# Patient Record
Sex: Female | Born: 1935 | Race: White | Hispanic: No | Marital: Married | Smoking: Never smoker
Health system: Southern US, Community
[De-identification: ages and names within clinical notes are randomized; demographics above are authoritative.]

## PROBLEM LIST (undated history)

## (undated) DIAGNOSIS — J449 Chronic obstructive pulmonary disease, unspecified: Secondary | ICD-10-CM

## (undated) DIAGNOSIS — K224 Dyskinesia of esophagus: Secondary | ICD-10-CM

## (undated) DIAGNOSIS — J45909 Unspecified asthma, uncomplicated: Secondary | ICD-10-CM

## (undated) DIAGNOSIS — I499 Cardiac arrhythmia, unspecified: Secondary | ICD-10-CM

## (undated) DIAGNOSIS — R519 Headache, unspecified: Secondary | ICD-10-CM

## (undated) DIAGNOSIS — K219 Gastro-esophageal reflux disease without esophagitis: Secondary | ICD-10-CM

## (undated) DIAGNOSIS — R609 Edema, unspecified: Secondary | ICD-10-CM

## (undated) DIAGNOSIS — F419 Anxiety disorder, unspecified: Secondary | ICD-10-CM

## (undated) DIAGNOSIS — E039 Hypothyroidism, unspecified: Secondary | ICD-10-CM

## (undated) DIAGNOSIS — M8580 Other specified disorders of bone density and structure, unspecified site: Secondary | ICD-10-CM

## (undated) DIAGNOSIS — G629 Polyneuropathy, unspecified: Secondary | ICD-10-CM

## (undated) DIAGNOSIS — R51 Headache: Secondary | ICD-10-CM

## (undated) DIAGNOSIS — G459 Transient cerebral ischemic attack, unspecified: Secondary | ICD-10-CM

## (undated) DIAGNOSIS — H9190 Unspecified hearing loss, unspecified ear: Secondary | ICD-10-CM

## (undated) DIAGNOSIS — I10 Essential (primary) hypertension: Secondary | ICD-10-CM

## (undated) DIAGNOSIS — M1990 Unspecified osteoarthritis, unspecified site: Secondary | ICD-10-CM

## (undated) HISTORY — PX: THYROIDECTOMY, PARTIAL: SHX18

## (undated) HISTORY — PX: CHOLECYSTECTOMY: SHX55

## (undated) HISTORY — PX: APPENDECTOMY: SHX54

---

## 2004-02-20 ENCOUNTER — Ambulatory Visit: Payer: Self-pay | Admitting: Internal Medicine

## 2004-07-18 ENCOUNTER — Ambulatory Visit: Payer: Self-pay | Admitting: Otolaryngology

## 2005-02-24 ENCOUNTER — Ambulatory Visit: Payer: Self-pay | Admitting: Internal Medicine

## 2005-07-22 ENCOUNTER — Ambulatory Visit: Payer: Self-pay | Admitting: Internal Medicine

## 2005-08-04 ENCOUNTER — Encounter: Payer: Self-pay | Admitting: Internal Medicine

## 2005-08-07 ENCOUNTER — Encounter: Payer: Self-pay | Admitting: Internal Medicine

## 2005-09-06 ENCOUNTER — Encounter: Payer: Self-pay | Admitting: Internal Medicine

## 2006-02-26 ENCOUNTER — Ambulatory Visit: Payer: Self-pay | Admitting: Internal Medicine

## 2007-03-04 ENCOUNTER — Ambulatory Visit: Payer: Self-pay | Admitting: Internal Medicine

## 2007-04-13 ENCOUNTER — Ambulatory Visit: Payer: Self-pay | Admitting: Unknown Physician Specialty

## 2008-03-05 ENCOUNTER — Ambulatory Visit: Payer: Self-pay | Admitting: Internal Medicine

## 2009-03-06 ENCOUNTER — Ambulatory Visit: Payer: Self-pay | Admitting: Internal Medicine

## 2010-03-07 ENCOUNTER — Ambulatory Visit: Payer: Self-pay | Admitting: Internal Medicine

## 2011-03-18 ENCOUNTER — Ambulatory Visit: Payer: Self-pay | Admitting: Internal Medicine

## 2012-03-18 ENCOUNTER — Ambulatory Visit: Payer: Self-pay | Admitting: Internal Medicine

## 2012-03-28 ENCOUNTER — Ambulatory Visit: Payer: Self-pay | Admitting: Internal Medicine

## 2012-07-21 ENCOUNTER — Ambulatory Visit: Payer: Self-pay | Admitting: Unknown Physician Specialty

## 2012-07-25 LAB — PATHOLOGY REPORT

## 2013-02-20 ENCOUNTER — Emergency Department: Payer: Self-pay | Admitting: Emergency Medicine

## 2013-02-20 LAB — CBC
HCT: 39.4 % (ref 35.0–47.0)
MCV: 93 fL (ref 80–100)
Platelet: 273 10*3/uL (ref 150–440)
RBC: 4.25 10*6/uL (ref 3.80–5.20)
RDW: 14.4 % (ref 11.5–14.5)
WBC: 10.6 10*3/uL (ref 3.6–11.0)

## 2013-02-20 LAB — URINALYSIS, COMPLETE
Bacteria: NONE SEEN
Bilirubin,UR: NEGATIVE
Nitrite: NEGATIVE
Ph: 6 (ref 4.5–8.0)
RBC,UR: 1 /HPF (ref 0–5)
Specific Gravity: 1.02 (ref 1.003–1.030)
Squamous Epithelial: <1
WBC UR: <1 /HPF (ref 0–5)

## 2013-02-20 LAB — COMPREHENSIVE METABOLIC PANEL
Albumin: 3.2 g/dL — ABNORMAL LOW (ref 3.4–5.0)
Anion Gap: 4 — ABNORMAL LOW (ref 7–16)
BUN: 21 mg/dL — ABNORMAL HIGH (ref 7–18)
Bilirubin,Total: 0.3 mg/dL (ref 0.2–1.0)
Calcium, Total: 9.1 mg/dL (ref 8.5–10.1)
Chloride: 104 mmol/L (ref 98–107)
Creatinine: 0.8 mg/dL (ref 0.60–1.30)
EGFR (African American): >60
EGFR (Non-African Amer.): >60
Glucose: 120 mg/dL — ABNORMAL HIGH (ref 65–99)
Potassium: 4.7 mmol/L (ref 3.5–5.1)
SGOT(AST): 26 U/L (ref 15–37)
Sodium: 135 mmol/L — ABNORMAL LOW (ref 136–145)

## 2013-02-20 LAB — PROTIME-INR: INR: 1

## 2013-03-21 ENCOUNTER — Ambulatory Visit: Payer: Self-pay | Admitting: Family Medicine

## 2014-03-22 ENCOUNTER — Ambulatory Visit: Payer: Self-pay | Admitting: Family Medicine

## 2014-06-28 ENCOUNTER — Ambulatory Visit
Admit: 2014-06-28 | Disposition: A | Discharge status: Home / Self Care | Payer: Self-pay | Attending: Surgery | Admitting: Surgery

## 2015-02-28 ENCOUNTER — Other Ambulatory Visit: Payer: Self-pay | Admitting: Surgery

## 2015-02-28 DIAGNOSIS — Z1231 Encounter for screening mammogram for malignant neoplasm of breast: Secondary | ICD-10-CM

## 2015-03-25 ENCOUNTER — Ambulatory Visit
Admission: RE | Admit: 2015-03-25 | Discharge: 2015-03-25 | Disposition: A | Discharge status: Home / Self Care | Payer: Medicare Other | Source: Ambulatory Visit | Attending: Surgery | Admitting: Surgery

## 2015-03-25 ENCOUNTER — Other Ambulatory Visit: Payer: Self-pay | Admitting: Surgery

## 2015-03-25 DIAGNOSIS — Z1231 Encounter for screening mammogram for malignant neoplasm of breast: Secondary | ICD-10-CM | POA: Diagnosis present

## 2015-07-31 ENCOUNTER — Encounter: Payer: Self-pay | Admitting: Emergency Medicine

## 2015-07-31 ENCOUNTER — Emergency Department: Payer: Medicare Other

## 2015-07-31 ENCOUNTER — Observation Stay
Admission: EM | Admit: 2015-07-31 | Discharge: 2015-08-01 | Disposition: A | Discharge status: Home / Self Care | Payer: Medicare Other | Attending: Internal Medicine | Admitting: Internal Medicine

## 2015-07-31 DIAGNOSIS — Z8673 Personal history of transient ischemic attack (TIA), and cerebral infarction without residual deficits: Secondary | ICD-10-CM | POA: Diagnosis not present

## 2015-07-31 DIAGNOSIS — S2241XA Multiple fractures of ribs, right side, initial encounter for closed fracture: Principal | ICD-10-CM | POA: Insufficient documentation

## 2015-07-31 DIAGNOSIS — J449 Chronic obstructive pulmonary disease, unspecified: Secondary | ICD-10-CM | POA: Diagnosis not present

## 2015-07-31 DIAGNOSIS — M8580 Other specified disorders of bone density and structure, unspecified site: Secondary | ICD-10-CM | POA: Insufficient documentation

## 2015-07-31 DIAGNOSIS — Y92091 Bathroom in other non-institutional residence as the place of occurrence of the external cause: Secondary | ICD-10-CM | POA: Insufficient documentation

## 2015-07-31 DIAGNOSIS — E039 Hypothyroidism, unspecified: Secondary | ICD-10-CM | POA: Insufficient documentation

## 2015-07-31 DIAGNOSIS — Y93E1 Activity, personal bathing and showering: Secondary | ICD-10-CM | POA: Diagnosis not present

## 2015-07-31 DIAGNOSIS — W010XXA Fall on same level from slipping, tripping and stumbling without subsequent striking against object, initial encounter: Secondary | ICD-10-CM | POA: Insufficient documentation

## 2015-07-31 DIAGNOSIS — S2239XA Fracture of one rib, unspecified side, initial encounter for closed fracture: Secondary | ICD-10-CM | POA: Diagnosis present

## 2015-07-31 DIAGNOSIS — W19XXXA Unspecified fall, initial encounter: Secondary | ICD-10-CM

## 2015-07-31 DIAGNOSIS — I10 Essential (primary) hypertension: Secondary | ICD-10-CM | POA: Diagnosis not present

## 2015-07-31 DIAGNOSIS — Y998 Other external cause status: Secondary | ICD-10-CM | POA: Insufficient documentation

## 2015-07-31 DIAGNOSIS — R52 Pain, unspecified: Secondary | ICD-10-CM

## 2015-07-31 DIAGNOSIS — K224 Dyskinesia of esophagus: Secondary | ICD-10-CM | POA: Insufficient documentation

## 2015-07-31 DIAGNOSIS — G629 Polyneuropathy, unspecified: Secondary | ICD-10-CM | POA: Insufficient documentation

## 2015-07-31 DIAGNOSIS — M8588 Other specified disorders of bone density and structure, other site: Secondary | ICD-10-CM | POA: Diagnosis not present

## 2015-07-31 DIAGNOSIS — E871 Hypo-osmolality and hyponatremia: Secondary | ICD-10-CM | POA: Insufficient documentation

## 2015-07-31 HISTORY — DX: Transient cerebral ischemic attack, unspecified: G45.9

## 2015-07-31 HISTORY — DX: Dyskinesia of esophagus: K22.4

## 2015-07-31 HISTORY — DX: Essential (primary) hypertension: I10

## 2015-07-31 HISTORY — DX: Hypothyroidism, unspecified: E03.9

## 2015-07-31 HISTORY — DX: Chronic obstructive pulmonary disease, unspecified: J44.9

## 2015-07-31 HISTORY — DX: Polyneuropathy, unspecified: G62.9

## 2015-07-31 HISTORY — DX: Other specified disorders of bone density and structure, unspecified site: M85.80

## 2015-07-31 LAB — CBC WITH DIFFERENTIAL/PLATELET
Basophils Absolute: 0 10*3/uL (ref 0–0.1)
Basophils Relative: 0 %
Eosinophils Absolute: 0 10*3/uL (ref 0–0.7)
Eosinophils Relative: 0 %
HCT: 41.1 % (ref 35.0–47.0)
Hemoglobin: 13.5 g/dL (ref 12.0–16.0)
Lymphocytes Relative: 7 %
Lymphs Abs: 0.8 10*3/uL — ABNORMAL LOW (ref 1.0–3.6)
MCH: 30.8 pg (ref 26.0–34.0)
MCHC: 32.8 g/dL (ref 32.0–36.0)
MCV: 93.9 fL (ref 80.0–100.0)
Monocytes Absolute: 0.7 10*3/uL (ref 0.2–0.9)
Monocytes Relative: 5 %
Neutro Abs: 11.4 10*3/uL — ABNORMAL HIGH (ref 1.4–6.5)
Neutrophils Relative %: 88 %
Platelets: 248 10*3/uL (ref 150–440)
RBC: 4.38 MIL/uL (ref 3.80–5.20)
RDW: 14.3 % (ref 11.5–14.5)
WBC: 12.9 10*3/uL — ABNORMAL HIGH (ref 3.6–11.0)

## 2015-07-31 LAB — COMPREHENSIVE METABOLIC PANEL
ALBUMIN: 3.8 g/dL (ref 3.5–5.0)
ALT: 24 U/L (ref 14–54)
ANION GAP: 6 (ref 5–15)
AST: 37 U/L (ref 15–41)
Alkaline Phosphatase: 64 U/L (ref 38–126)
BUN: 17 mg/dL (ref 6–20)
CALCIUM: 8.7 mg/dL — AB (ref 8.9–10.3)
CO2: 26 mmol/L (ref 22–32)
Chloride: 101 mmol/L (ref 101–111)
Creatinine, Ser: 0.82 mg/dL (ref 0.44–1.00)
GFR calc non Af Amer: >60 mL/min (ref >60)
GLUCOSE: 148 mg/dL — AB (ref 65–99)
POTASSIUM: 4.3 mmol/L (ref 3.5–5.1)
SODIUM: 133 mmol/L — AB (ref 135–145)
TOTAL PROTEIN: 7 g/dL (ref 6.5–8.1)
Total Bilirubin: 0.5 mg/dL (ref 0.3–1.2)

## 2015-07-31 LAB — TROPONIN I: Troponin I: <0.03 ng/mL (ref <0.031)

## 2015-07-31 MED ORDER — MORPHINE SULFATE (PF) 4 MG/ML IV SOLN
4.0000 mg | Freq: Once | INTRAVENOUS | Status: AC
Start: 2015-07-31 — End: 2015-07-31
  Administered 2015-07-31: 4 mg via INTRAVENOUS
  Filled 2015-07-31: qty 1

## 2015-07-31 MED ORDER — ENOXAPARIN SODIUM 40 MG/0.4ML ~~LOC~~ SOLN
40.0000 mg | SUBCUTANEOUS | Status: DC
  Administered 2015-07-31: 40 mg via SUBCUTANEOUS
  Filled 2015-07-31: qty 0.4

## 2015-07-31 MED ORDER — PROPRANOLOL HCL 20 MG PO TABS
20.0000 mg | ORAL_TABLET | Freq: Two times a day (BID) | ORAL | Status: DC
  Administered 2015-07-31 – 2015-08-01 (×2): 20 mg via ORAL
  Filled 2015-07-31 (×2): qty 1

## 2015-07-31 MED ORDER — HYOSCYAMINE SULFATE 0.125 MG PO TABS
0.1250 mg | ORAL_TABLET | Freq: Every day | ORAL | Status: DC
  Filled 2015-07-31: qty 1

## 2015-07-31 MED ORDER — SODIUM CHLORIDE 0.9 % IV SOLN
INTRAVENOUS | Status: DC
  Administered 2015-07-31 – 2015-08-01 (×2): via INTRAVENOUS

## 2015-07-31 MED ORDER — MOMETASONE FURO-FORMOTEROL FUM 200-5 MCG/ACT IN AERO
2.0000 | INHALATION_SPRAY | Freq: Two times a day (BID) | RESPIRATORY_TRACT | Status: DC
  Administered 2015-07-31: 2 via RESPIRATORY_TRACT
  Filled 2015-07-31: qty 8.8

## 2015-07-31 MED ORDER — POLYETHYLENE GLYCOL 3350 17 GM/SCOOP PO POWD
17.0000 g | Freq: Every day | ORAL | Status: DC
  Filled 2015-07-31 (×2): qty 255

## 2015-07-31 MED ORDER — CALCIUM CARBONATE 1500 (600 CA) MG PO TABS: 1.0000 | ORAL_TABLET | Freq: Every day | ORAL | Status: DC

## 2015-07-31 MED ORDER — MONTELUKAST SODIUM 10 MG PO TABS
10.0000 mg | ORAL_TABLET | Freq: Every day | ORAL | Status: DC
  Administered 2015-07-31: 10 mg via ORAL
  Filled 2015-07-31: qty 1

## 2015-07-31 MED ORDER — ACETAMINOPHEN 650 MG RE SUPP
650.0000 mg | Freq: Four times a day (QID) | RECTAL | Status: DC | PRN
Start: 2015-07-31 — End: 2015-08-01

## 2015-07-31 MED ORDER — MORPHINE SULFATE (PF) 2 MG/ML IV SOLN: 2.0000 mg | INTRAVENOUS | Status: DC | PRN

## 2015-07-31 MED ORDER — OXYCODONE HCL 5 MG PO TABS
5.0000 mg | ORAL_TABLET | ORAL | Status: DC | PRN
  Administered 2015-07-31: 5 mg via ORAL
  Filled 2015-07-31: qty 1

## 2015-07-31 MED ORDER — DILTIAZEM HCL ER COATED BEADS 120 MG PO CP24
120.0000 mg | ORAL_CAPSULE | Freq: Every day | ORAL | Status: DC
  Administered 2015-08-01: 120 mg via ORAL
  Filled 2015-07-31: qty 1

## 2015-07-31 MED ORDER — FLUTICASONE PROPIONATE 50 MCG/ACT NA SUSP
2.0000 | Freq: Every day | NASAL | Status: DC
Start: 2015-08-01 — End: 2015-08-01
  Filled 2015-07-31: qty 16

## 2015-07-31 MED ORDER — ALPRAZOLAM 0.25 MG PO TABS: 0.2500 mg | ORAL_TABLET | Freq: Every evening | ORAL | Status: DC | PRN

## 2015-07-31 MED ORDER — PANTOPRAZOLE SODIUM 40 MG PO TBEC
40.0000 mg | DELAYED_RELEASE_TABLET | Freq: Every day | ORAL | Status: DC
  Administered 2015-08-01: 40 mg via ORAL
  Filled 2015-07-31: qty 1

## 2015-07-31 MED ORDER — RALOXIFENE HCL 60 MG PO TABS
60.0000 mg | ORAL_TABLET | Freq: Every day | ORAL | Status: DC
  Filled 2015-07-31 (×2): qty 1

## 2015-07-31 MED ORDER — LEVOTHYROXINE SODIUM 112 MCG PO TABS
112.0000 ug | ORAL_TABLET | Freq: Every day | ORAL | Status: DC
Start: 2015-08-01 — End: 2015-08-01
  Administered 2015-08-01: 112 ug via ORAL
  Filled 2015-07-31: qty 1

## 2015-07-31 MED ORDER — CLOPIDOGREL BISULFATE 75 MG PO TABS
75.0000 mg | ORAL_TABLET | Freq: Every day | ORAL | Status: DC
  Administered 2015-08-01: 75 mg via ORAL
  Filled 2015-07-31: qty 1

## 2015-07-31 MED ORDER — ONDANSETRON HCL 4 MG/2ML IJ SOLN: 4.0000 mg | Freq: Four times a day (QID) | INTRAMUSCULAR | Status: DC | PRN

## 2015-07-31 MED ORDER — ONDANSETRON HCL 4 MG PO TABS: 4.0000 mg | ORAL_TABLET | Freq: Four times a day (QID) | ORAL | Status: DC | PRN

## 2015-07-31 MED ORDER — ACETAMINOPHEN 325 MG PO TABS: 650.0000 mg | ORAL_TABLET | Freq: Four times a day (QID) | ORAL | Status: DC | PRN

## 2015-07-31 MED ORDER — SIMVASTATIN 20 MG PO TABS
20.0000 mg | ORAL_TABLET | Freq: Every day | ORAL | Status: DC
  Administered 2015-07-31: 20 mg via ORAL
  Filled 2015-07-31: qty 1

## 2015-07-31 MED ORDER — ONDANSETRON HCL 4 MG/2ML IJ SOLN
4.0000 mg | Freq: Once | INTRAMUSCULAR | Status: AC
  Administered 2015-07-31: 4 mg via INTRAVENOUS
  Filled 2015-07-31: qty 2

## 2015-07-31 MED ORDER — SODIUM CHLORIDE 0.9 % IV BOLUS (SEPSIS)
500.0000 mL | Freq: Once | INTRAVENOUS | Status: AC
  Administered 2015-07-31: 500 mL via INTRAVENOUS

## 2015-07-31 MED ORDER — OXYCODONE HCL 5 MG PO TABS
5.0000 mg | ORAL_TABLET | Freq: Once | ORAL | Status: AC
  Administered 2015-07-31: 5 mg via ORAL
  Filled 2015-07-31: qty 1

## 2015-07-31 MED ORDER — CALCIUM CARBONATE-VITAMIN D 500-200 MG-UNIT PO TABS
2.0000 | ORAL_TABLET | Freq: Every day | ORAL | Status: DC
  Administered 2015-08-01: 2 via ORAL
  Filled 2015-07-31: qty 2

## 2015-07-31 MED ORDER — IBUPROFEN 400 MG PO TABS
400.0000 mg | ORAL_TABLET | Freq: Once | ORAL | Status: AC
  Administered 2015-07-31: 400 mg via ORAL
  Filled 2015-07-31: qty 1

## 2015-07-31 MED ORDER — ACETAMINOPHEN 500 MG PO TABS
1000.0000 mg | ORAL_TABLET | Freq: Once | ORAL | Status: AC
  Administered 2015-07-31: 1000 mg via ORAL
  Filled 2015-07-31: qty 2

## 2015-07-31 NOTE — ED Notes (Signed)
Pt to ed via [facility] from home with reports of having a fall this am. Pt fell hitting the back right side rib area. Pt c/o pain worse with inspiration. No acute distress noted on arrival, pt a/ox3 with family at bedside.

## 2015-07-31 NOTE — ED Provider Notes (Signed)
[facility] Emergency Department Provider Note   ____________________________________________  Time seen: Approximately 12:20 PM  I have reviewed the triage vital signs and the nursing notes.   HISTORY  Chief Complaint Fall and Rib Injury    HPI Jackie Rangel is a 82 y.o. female with history of hypertension, COPD, thyroid disease, GERD, hyperlipidemia, on chronic antiplatelet therapy with Plavix, who presents for evaluation of right posterior lateral chest wall pain after a fall this morning, sudden onset, constant since onset, severe, worse with movement and deep inspiration. Patient reports that she tripped in the bathroom and fell onto a drawer which was open, her right upper thorax landing on the drawer. She did not hit her head or lose consciousness. She has no other pain complaints. No vomiting, diarrhea, fevers, chills or recent illness.   Past Medical History  Diagnosis Date  . Thyroid disease   . COPD (chronic obstructive pulmonary disease) (HCC)   . Hypertension     There are no active problems to display for this patient.   No past surgical history on file.  Current Outpatient Rx  Name  Route  Sig  Dispense  Refill  . ALPRAZolam (XANAX) 0.5 MG tablet   Oral   Take 0.5 tablets by mouth at bedtime as needed.         . budesonide-formoterol (SYMBICORT) 160-4.5 MCG/ACT inhaler   Inhalation   Inhale 2 puffs into the lungs 2 (two) times daily.         . calcium carbonate (CALTRATE 600) 1500 (600 Ca) MG TABS tablet   Oral   Take 1 tablet by mouth daily.         Marland Kitchen CARTIA XT 120 MG 24 hr capsule   Oral   Take 1 capsule by mouth daily.           Dispense as written.   . clopidogrel (PLAVIX) 75 MG tablet   Oral   Take 1 tablet by mouth daily.         Marland Kitchen esomeprazole (NEXIUM) 40 MG capsule   Oral   Take 1 capsule by mouth daily.         . fluticasone (FLONASE) 50 MCG/ACT nasal spray   Nasal   Place 2 sprays into  the nose daily as needed.         . hyoscyamine (LEVSIN, ANASPAZ) 0.125 MG tablet   Oral   Take 1 tablet by mouth daily.         Marland Kitchen levothyroxine (SYNTHROID, LEVOTHROID) 112 MCG tablet   Oral   Take 112 mcg by mouth daily before breakfast.         . montelukast (SINGULAIR) 10 MG tablet   Oral   Take 1 tablet by mouth at bedtime.         . polyethylene glycol powder (GLYCOLAX/MIRALAX) powder   Oral   Take 17 g by mouth daily.         . propranolol (INDERAL) 20 MG tablet   Oral   Take 1 tablet by mouth 2 (two) times daily.         . raloxifene (EVISTA) 60 MG tablet   Oral   Take 1 tablet by mouth daily.         . simvastatin (ZOCOR) 20 MG tablet   Oral   Take 1 tablet by mouth at bedtime.           Allergies Review of patient's allergies indicates no known allergies.  Family History  Problem Relation Age of Onset  . Breast cancer Daughter 35    Social History Social History  Substance Use Topics  . Smoking status: Never Smoker   . Smokeless tobacco: None  . Alcohol Use: No    Review of Systems Constitutional: No fever/chills Eyes: No visual changes. ENT: No sore throat. Cardiovascular: +chest pain. Respiratory: Denies shortness of breath. Gastrointestinal: No abdominal pain.  No nausea, no vomiting.  No diarrhea.  No constipation. Genitourinary: Negative for dysuria. Musculoskeletal: Positive for back pain. Skin: Negative for rash. Neurological: Negative for headaches, focal weakness or numbness.  10-point ROS otherwise negative.  ____________________________________________   PHYSICAL EXAM:  VITAL SIGNS: ED Triage Vitals  Enc Vitals Group     BP 07/31/15 1118 115/99 mmHg     Pulse Rate 07/31/15 1118 69     Resp 07/31/15 1118 20     Temp 07/31/15 1118 97.4 F (36.3 C)     Temp Source 07/31/15 1118 Oral     SpO2 07/31/15 1118 99 %     Weight 07/31/15 1118 136 lb (61.689 kg)     Height 07/31/15 1118  (1.549 m)     Head Cir  --      Peak Flow --      Pain Score 07/31/15 1119 9     Pain Loc --      Pain Edu? --      Excl. in GC? --     Constitutional: Alert and oriented. Nontoxic-appearing and in no acute distress until she has to move at all at which point she does develop severe pain. Eyes: Conjunctivae are normal. PERRL. EOMI. Head: Atraumatic. Nose: No congestion/rhinnorhea. Mouth/Throat: Mucous membranes are moist.  Oropharynx non-erythematous. Neck: No stridor.  No cervical spine tenderness to palpation. Cardiovascular: Normal rate, regular rhythm. Grossly normal heart sounds.  Good peripheral circulation. Respiratory: Normal respiratory effort.  No retractions. Lungs CTAB. Gastrointestinal: Soft and nontender. No distention.  No CVA tenderness. Genitourinary: deferred Musculoskeletal: No lower extremity tenderness nor edema.  No joint effusions. Moderate tenderness to palpation throughout the midline of the thoracic and lumbar spine. Tenderness to palpation throughout the right posterior lateral ribs without flail chest or obvious deformity. Pelvis stable to rock and compression. Neurologic:  Normal speech and language. No gross focal neurologic deficits are appreciated.  Skin:  Skin is warm, dry and intact. No rash noted. Psychiatric: Mood and affect are normal. Speech and behavior are normal.  ____________________________________________   LABS (all labs ordered are listed, but only abnormal results are displayed)  Labs Reviewed  CBC WITH DIFFERENTIAL/PLATELET - Abnormal; Notable for the following:    WBC 12.9 (*)    Neutro Abs 11.4 (*)    Lymphs Abs 0.8 (*)    All other components within normal limits  COMPREHENSIVE METABOLIC PANEL - Abnormal; Notable for the following:    Sodium 133 (*)    Glucose, Bld 148 (*)    Calcium 8.7 (*)    All other components within normal limits  TROPONIN I   ____________________________________________  EKG ED ECG REPORT I, Gayla Doss, the attending  physician, personally viewed and interpreted this ECG.   Date: 07/31/2015  EKG Time: 11:18  Rate: 68  Rhythm: normal sinus rhythm  Axis: normal  Intervals:none  ST&T Change: No acute ST elevation.  ____________________________________________  RADIOLOGY  CXR  FINDINGS: Cardiac shadow is within normal limits. The lungs are well aerated bilaterally. Minimal left basilar atelectasis is seen. There  are minimally displaced fractures of the right fourth, seventh, eighth and ninth ribs on the right. No definitive pneumothorax is seen on this exam. No compression deformity is noted.  IMPRESSION: Multiple right rib fractures without complicating factors.  Xray thoracic spine IMPRESSION: Multilevel degenerative change without acute abnormality.  Xray lumbar spine IMPRESSION: Multilevel degenerative change without acute abnormality.  ____________________________________________   PROCEDURES  Procedure(s) performed: None  Critical Care performed: No  ____________________________________________   INITIAL IMPRESSION / ASSESSMENT AND PLAN / ED COURSE  Pertinent labs & imaging results that were available during my care of the patient were reviewed by me and considered in my medical decision making (see chart for details).  Jackie Rangel is a 82 y.o. female with history of hypertension, COPD, thyroid disease, GERD, hyperlipidemia, on chronic antiplatelet therapy with Plavix, who presents for evaluation of right posterior lateral chest wall pain after a fall this morning. On exam, she is well-appearing and in no acute distress, vital signs stable, she is afebrile. She does have tenderness throughout the right posterior lateral rib cage as well as the midline of the thoracic and lumbar spine. EKG reassuring. CBC with leukocytosis, white blood cell count is 12.9, likely catecholamine/pain induced. Unremarkable CMP, negative troponin. Imaging shows 4 minimally displaced  right-sided rib fractures. We'll admit for pain control and observation, pulmonary toilet, given age, increased mortality risk into the setting of multiple rib fractures. Case discussed with the hospitalist, Dr.Kalisetti,  for admission at 1:55 PM. ____________________________________________   FINAL CLINICAL IMPRESSION(S) / ED DIAGNOSES  Final diagnoses:  Fall, initial encounter  Multiple rib fractures, right, closed, initial encounter      NEW MEDICATIONS STARTED DURING THIS VISIT:  New Prescriptions   No medications on file     Note:  This document was prepared using Dragon voice recognition software and may include unintentional dictation errors.    Gayla DossEryka A Shadee Rathod, MD 07/31/15 1357

## 2015-07-31 NOTE — H&P (Signed)
[facility] [place] at [facility]   PATIENT NAME: Jackie Rangel    MR#:  [id number]  DATE OF BIRTH:  06/19/1928  DATE OF ADMISSION:  07/31/2015  PRIMARY CARE PHYSICIAN: Danella Penton, MD   REQUESTING/REFERRING PHYSICIAN: Dr. Toney Rakes  CHIEF COMPLAINT:   Chief Complaint  Patient presents with  . Fall  . Rib Injury    HISTORY OF PRESENT ILLNESS:  Jackie Rangel  is a 82 y.o. female with a known history of COPD not on oxygen, hypothyroidism, neuropathy, hypertension, osteopenia comes from home after a mechanical fall and noted to have right rib fractures. Patient has been in her normal state of health, was walking to the bathroom and had a mechanical fall while because of the shower door and the right side for chest hit a opened drawer. Since then she had intense sharp pain in the right side of her chest, unable to breathe and she thought she punctured a lung. Brought to the emergency room, sats are stable. X-ray revealing multiple right-sided rib fractures. Patient is requiring IV pain medications, couldn't even move without pain and is being admitted for pain control. Denies any recent fevers or chills. No cough congestion or nausea or vomiting. Denies any loss of consciousness or chest pain or lightheadedness prior to the fall.  PAST MEDICAL HISTORY:   Past Medical History  Diagnosis Date  . Hypothyroidism   . COPD (chronic obstructive pulmonary disease) (HCC)     not on home o2  . Hypertension   . Peripheral neuropathy (HCC)   . Esophageal spasm   . TIA (transient ischemic attack)   . Osteopenia     PAST SURGICAL HISTORY:   Past Surgical History  Procedure Laterality Date  . Cholecystectomy    . Appendectomy    . Thyroidectomy, partial      SOCIAL HISTORY:   Social History  Substance Use Topics  . Smoking status: Never Smoker   . Smokeless tobacco: Not on file  . Alcohol Use: No    FAMILY HISTORY:   Family History  Problem  Relation Age of Onset  . Breast cancer Daughter 14  . Ovarian cancer Mother   . CAD Father     DRUG ALLERGIES:  No Known Allergies  REVIEW OF SYSTEMS:   Review of Systems  Constitutional: Negative for fever, chills, weight loss and malaise/fatigue.  HENT: Positive for hearing loss. Negative for ear discharge, ear pain, nosebleeds and tinnitus.   Eyes: Negative for blurred vision, double vision and photophobia.  Respiratory: Negative for cough, hemoptysis, shortness of breath and wheezing.   Cardiovascular: Positive for chest pain. Negative for palpitations, orthopnea and leg swelling.  Gastrointestinal: Negative for heartburn, nausea, vomiting, abdominal pain, diarrhea, constipation and melena.  Genitourinary: Negative for dysuria, urgency, frequency and hematuria.  Musculoskeletal: Negative for myalgias, back pain and neck pain.  Skin: Negative for rash.  Neurological: Negative for dizziness, tingling, tremors, sensory change, speech change, focal weakness and headaches.  Endo/Heme/Allergies: Does not bruise/bleed easily.  Psychiatric/Behavioral: Negative for depression.    MEDICATIONS AT HOME:   Prior to Admission medications   Medication Sig Start Date End Date Taking? Authorizing Provider  ALPRAZolam Prudy Feeler) 0.5 MG tablet Take 0.5 tablets by mouth at bedtime as needed. 06/20/15  Yes Historical Provider, MD  budesonide-formoterol (SYMBICORT) 160-4.5 MCG/ACT inhaler Inhale 2 puffs into the lungs 2 (two) times daily.   Yes Historical Provider, MD  calcium carbonate (CALTRATE 600) 1500 (600 Ca) MG TABS tablet  Take 1 tablet by mouth daily.   Yes Historical Provider, MD  CARTIA XT 120 MG 24 hr capsule Take 1 capsule by mouth daily. 05/07/15  Yes Historical Provider, MD  clopidogrel (PLAVIX) 75 MG tablet Take 1 tablet by mouth daily. 07/01/15  Yes Historical Provider, MD  esomeprazole (NEXIUM) 40 MG capsule Take 1 capsule by mouth daily. 07/25/15  Yes Historical Provider, MD  fluticasone  (FLONASE) 50 MCG/ACT nasal spray Place 2 sprays into the nose daily as needed. 04/08/15 04/07/16 Yes Historical Provider, MD  hyoscyamine (LEVSIN, ANASPAZ) 0.125 MG tablet Take 1 tablet by mouth daily. 05/13/15  Yes Historical Provider, MD  levothyroxine (SYNTHROID, LEVOTHROID) 112 MCG tablet Take 112 mcg by mouth daily before breakfast.   Yes Historical Provider, MD  montelukast (SINGULAIR) 10 MG tablet Take 1 tablet by mouth at bedtime. 06/06/15  Yes Historical Provider, MD  polyethylene glycol powder (GLYCOLAX/MIRALAX) powder Take 17 g by mouth daily.   Yes Historical Provider, MD  propranolol (INDERAL) 20 MG tablet Take 1 tablet by mouth 2 (two) times daily. 06/07/15  Yes Historical Provider, MD  raloxifene (EVISTA) 60 MG tablet Take 1 tablet by mouth daily. 05/29/15  Yes Historical Provider, MD  simvastatin (ZOCOR) 20 MG tablet Take 1 tablet by mouth at bedtime. 05/30/15  Yes Historical Provider, MD      VITAL SIGNS:  Blood pressure 115/99, pulse 69, temperature 97.4 F (36.3 C), temperature source Oral, resp. rate 20, height  (1.549 m), weight 61.689 kg (136 lb), SpO2 99 %.  PHYSICAL EXAMINATION:   Physical Exam  GENERAL:  82 y.o.-year-old patient lying in the bed with no acute distress. Very hard of hearing. EYES: Pupils equal, round, reactive to light and accommodation. No scleral icterus. Extraocular muscles intact.  HEENT: Head atraumatic, normocephalic. Oropharynx and nasopharynx clear.  NECK:  Supple, no jugular venous distention. No thyroid enlargement, no tenderness.  LUNGS: Normal breath sounds bilaterally, no wheezing, rales,rhonchi or crepitation. No use of accessory muscles of respiration. Decreased bibasilar breath sounds as patient unable to take a deep inspiration. Bruise noted on the posterior side of the right chest. CARDIOVASCULAR: S1, S2 normal. No  rubs, or gallops. 3/6 systolic murmur is present. ABDOMEN: Soft, nontender, nondistended. Bowel sounds present. No  organomegaly or mass.  EXTREMITIES: No pedal edema, cyanosis, or clubbing.  NEUROLOGIC: Cranial nerves II through XII are intact. Muscle strength 5/5 in all extremities. Sensation intact. Gait not checked.  PSYCHIATRIC: The patient is alert and oriented x 3.  SKIN: No obvious rash, lesion, or ulcer.   LABORATORY PANEL:   CBC  Recent Labs Lab 07/31/15 1309  WBC 12.9*  HGB 13.5  HCT 41.1  PLT 248   ------------------------------------------------------------------------------------------------------------------  Chemistries   Recent Labs Lab 07/31/15 1309  NA 133*  K 4.3  CL 101  CO2 26  GLUCOSE 148*  BUN 17  CREATININE 0.82  CALCIUM 8.7*  AST 37  ALT 24  ALKPHOS 64  BILITOT 0.5   ------------------------------------------------------------------------------------------------------------------  Cardiac Enzymes  Recent Labs Lab 07/31/15 1309  TROPONINI <0.03   ------------------------------------------------------------------------------------------------------------------  RADIOLOGY:  Dg Chest 2 View  07/31/2015  CLINICAL DATA:  Recent fall with chest pain, initial encounter EXAM: CHEST  2 VIEW COMPARISON:  07/18/04 FINDINGS: Cardiac shadow is within normal limits. The lungs are well aerated bilaterally. Minimal left basilar atelectasis is seen. There are minimally displaced fractures of the right fourth, seventh, eighth and ninth ribs on the right. No definitive pneumothorax is seen on this exam.  No compression deformity is noted. IMPRESSION: Multiple right rib fractures without complicating factors. Electronically Signed   By: Alcide CleverMark  Lukens Rangel.   On: 07/31/2015 12:37   Dg Ribs Unilateral Right  07/31/2015  CLINICAL DATA:  Recent fall with right-sided rib pain, initial encounter EXAM: RIGHT RIBS - 2 VIEW COMPARISON:  None. FINDINGS: Multiple right-sided rib fractures are identified to include the fourth, seventh, eighth, ninth and tenth ribs. No definitive  pneumothorax is seen. IMPRESSION: Multiple right rib fractures without acute pneumothorax. Electronically Signed   By: Alcide CleverMark  Lukens Rangel.   On: 07/31/2015 12:40   Dg Thoracic Spine 2 View  07/31/2015  CLINICAL DATA:  Recent fall with right-sided chest pain and multiple rib fractures, initial encounter EXAM: THORACIC SPINE 2 VIEWS COMPARISON:  None. FINDINGS: There is no evidence of thoracic spine fracture. Alignment is normal. No other significant bone abnormalities are identified. Multiple right rib fractures are again seen. IMPRESSION: No acute thoracic spine abnormality noted. Electronically Signed   By: Alcide CleverMark  Lukens Rangel.   On: 07/31/2015 12:42   Dg Lumbar Spine 2-3 Views  07/31/2015  CLINICAL DATA:  Low back pain following recent fall, initial encounter EXAM: LUMBAR SPINE - 2-3 VIEW COMPARISON:  None. FINDINGS: Five lumbar type vertebral bodies are well visualized. Mild osteophytic changes are seen. Facet hypertrophic changes are noted contributing to mild grade 1 anterolisthesis of L4 on L5. Disc space narrowing with vacuum disc phenomena is noted from L3-S1. IMPRESSION: Multilevel degenerative change without acute abnormality. Electronically Signed   By: Alcide CleverMark  Lukens Rangel.   On: 07/31/2015 12:44    EKG:   Orders placed or performed during the hospital encounter of 07/31/15  . EKG 12-Lead  . EKG 12-Lead    IMPRESSION AND PLAN:   Jackie Rangel  is a 82 y.o. female with a known history of COPD not on oxygen, hypothyroidism, neuropathy, hypertension, osteopenia comes from home after a mechanical fall and noted to have right rib fractures.  1. Mechanical fall and right rib fractures-multiple rib fractures and right side. Known history of osteopenia. - continue calcium and vitamin D supplements. -Physical therapy consult. -Pain control and IV fluids. Incentive incentive spirometry -Oxygen supplementation as needed  2. COPD-stable, not an oxygen. -Continue her inhalers.  3. Esophageal  spasms-has been on Cardizem for a long time. Continue that also on Protonix.  4. Hypertension-on propranolol. Also on Cardizem.  5. DVT prophylaxis-on Lovenox  6. Hyponatremia-continue IV fluids   Physical therapy consult.  All the records are reviewed and case discussed with ED provider. Management plans discussed with the patient, family and they are in agreement.  CODE STATUS: Full code  TOTAL TIME TAKING CARE OF THIS PATIENT: 50 minutes.    Jackie Rangel,Jackie Rangel on 07/31/2015 at 2:52 PM  Between 7am to 6pm - Pager - [phone]  After 6pm go to [email or web address] - password [email or web address]  [facility]  Office  [phone]  CC: Primary care physician; Danella PentonMark F Miller, MD

## 2015-08-01 LAB — CBC
HCT: 35.3 % (ref 35.0–47.0)
Hemoglobin: 11.9 g/dL — ABNORMAL LOW (ref 12.0–16.0)
MCH: 31.8 pg (ref 26.0–34.0)
MCHC: 33.8 g/dL (ref 32.0–36.0)
MCV: 93.9 fL (ref 80.0–100.0)
PLATELETS: 230 10*3/uL (ref 150–440)
RBC: 3.76 MIL/uL — ABNORMAL LOW (ref 3.80–5.20)
RDW: 14.4 % (ref 11.5–14.5)
WBC: 7.3 10*3/uL (ref 3.6–11.0)

## 2015-08-01 LAB — BASIC METABOLIC PANEL
ANION GAP: 4 — AB (ref 5–15)
BUN: 12 mg/dL (ref 6–20)
CALCIUM: 8 mg/dL — AB (ref 8.9–10.3)
CO2: 25 mmol/L (ref 22–32)
CREATININE: 0.72 mg/dL (ref 0.44–1.00)
Chloride: 107 mmol/L (ref 101–111)
Glucose, Bld: 91 mg/dL (ref 65–99)
POTASSIUM: 4.2 mmol/L (ref 3.5–5.1)
Sodium: 136 mmol/L (ref 135–145)

## 2015-08-01 MED ORDER — OXYCODONE HCL 5 MG PO TABS: 5.0000 mg | ORAL_TABLET | Freq: Four times a day (QID) | ORAL | Status: DC | PRN

## 2015-08-01 MED ORDER — HYOSCYAMINE SULFATE 0.125 MG SL SUBL
0.1250 mg | SUBLINGUAL_TABLET | Freq: Every day | SUBLINGUAL | Status: DC
  Administered 2015-08-01: 0.125 mg via SUBLINGUAL
  Filled 2015-08-01: qty 1

## 2015-08-01 MED ORDER — POLYETHYLENE GLYCOL 3350 17 G PO PACK
17.0000 g | PACK | Freq: Every day | ORAL | Status: DC
  Administered 2015-08-01: 17 g via ORAL
  Filled 2015-08-01: qty 1

## 2015-08-01 NOTE — Discharge Instructions (Signed)
°  DIET:  Cardiac diet  DISCHARGE CONDITION:  Stable  ACTIVITY:  Activity as tolerated  OXYGEN:  Home Oxygen: No.   Oxygen Delivery: room air  DISCHARGE LOCATION:  Home with home health   If you experience worsening of your admission symptoms, develop shortness of breath, life threatening emergency, suicidal or homicidal thoughts you must seek medical attention immediately by calling 911 or calling your MD immediately  if symptoms less severe.  You Must read complete instructions/literature along with all the possible adverse reactions/side effects for all the Medicines you take and that have been prescribed to you. Take any new Medicines after you have completely understood and accpet all the possible adverse reactions/side effects.   Please note  You were cared for by a hospitalist during your hospital stay. If you have any questions about your discharge medications or the care you received while you were in the hospital after you are discharged, you can call the unit and asked to speak with the hospitalist on call if the hospitalist that took care of you is not available. Once you are discharged, your primary care physician will handle any further medical issues. Please note that NO REFILLS for any discharge medications will be authorized once you are discharged, as it is imperative that you return to your primary care physician (or establish a relationship with a primary care physician if you do not have one) for your aftercare needs so that they can reassess your need for medications and monitor your lab values.   Please see your doctor in 1 week. You will need a follow up chest xray in 1 week.

## 2015-08-01 NOTE — Evaluation (Signed)
Physical Therapy Evaluation Patient Details Name: Jackie Rangel MRN: [id number] DOB: 07/14/1928 Today's Date: 08/01/2015   History of Present Illness  Pt admitted for rib fracture on R 4, 7,8,9, and 10th ribs from mechanical fall at home. Pt with history of COPD, neuropathy, and HTN.  Clinical Impression  Pt is a pleasant 82 year old female who was admitted for fall with rib fractures. Pt performs bed mobility with cga, transfers with supervision, and ambulation with cga and no AD. Pt reaching out for railing during ambulation, would benefit from trial use of AD to improve balance and decrease risk for falls. Pt demonstrates deficits with strength/ambulation/mobility. Would benefit from skilled PT to address above deficits and promote optimal return to PLOF. Recommend transition to HHPT upon discharge from acute hospitalization.       Follow Up Recommendations Home health PT    Equipment Recommendations  Rolling walker with 5" wheels    Recommendations for Other Services       Precautions / Restrictions Precautions Precautions: Fall Restrictions Weight Bearing Restrictions: No      Mobility  Bed Mobility Overal bed mobility: Needs Assistance Bed Mobility: Sit to Supine       Sit to supine: Min guard   General bed mobility comments: Pt needs assistance with bringing B LE up to bed and cues for positioning secondary to pain. Safe technique performed  Transfers Overall transfer level: Needs assistance Equipment used: None Transfers: Sit to/from Stand Sit to Stand: Supervision         General transfer comment: No AD used, safe technique performed. Pt heavily guarded on R side  Ambulation/Gait Ambulation/Gait assistance: Min guard Ambulation Distance (Feet): 200 Feet Assistive device: None Gait Pattern/deviations: Step-to pattern     General Gait Details: ambulation performed with slow step to gait pattern noted. Pt with antalgic gait, however no LOB noted.  Pt reaching for railing during ambulation. Would benefit from trial use of RW vs SPC.  Stairs            Wheelchair Mobility    Modified Rankin (Stroke Patients Only)       Balance Overall balance assessment: History of Falls;Needs assistance Sitting-balance support: Feet supported Sitting balance-Leahy Scale: Normal     Standing balance support: No upper extremity supported Standing balance-Leahy Scale: Fair                               Pertinent Vitals/Pain Pain Assessment: 0-10 Pain Score: 9  Pain Location: R side rib pain Pain Descriptors / Indicators: Dull;Discomfort Pain Intervention(s): Limited activity within patient's tolerance    Home Living Family/patient expects to be discharged to:: Private residence Living Arrangements: Spouse/significant other Available Help at Discharge: Family;Available 24 hours/day Type of Home: House Home Access: Level entry     Home Layout: One level Home Equipment: Cane - single point      Prior Function Level of Independence: Independent               Hand Dominance        Extremity/Trunk Assessment   Upper Extremity Assessment: Overall WFL for tasks assessed           Lower Extremity Assessment: Generalized weakness (grossly 4/5 in B LE)         Communication   Communication: No difficulties;HOH  Cognition Arousal/Alertness: Awake/alert Behavior During Therapy: WFL for tasks assessed/performed Overall Cognitive Status: Within Functional Limits for tasks assessed  General Comments      Exercises Other Exercises Other Exercises: Upon arrival, watched pt ambulate to bathroom. Observed pt brushing teeth with supervision. Safe technique performed. Pt also able to wash hands with supervision      Assessment/Plan    PT Assessment Patient needs continued PT services  PT Diagnosis Difficulty walking;Generalized weakness;Acute pain   PT Problem List  Decreased strength;Decreased mobility;Decreased knowledge of use of DME;Pain  PT Treatment Interventions DME instruction;Gait training;Therapeutic exercise   PT Goals (Current goals can be found in the Care Plan section) Acute Rehab PT Goals Patient Stated Goal: to get stronger PT Goal Formulation: With patient/family Time For Goal Achievement: 08/15/15 Potential to Achieve Goals: Good    Frequency 7X/week   Barriers to discharge        Co-evaluation               End of Session Equipment Utilized During Treatment: Gait belt Activity Tolerance: Patient limited by pain Patient left: in bed;with bed alarm set;with family/visitor present Nurse Communication: Mobility status    Functional Assessment Tool Used: clinical judgement Functional Limitation: Mobility: Walking and moving around Mobility: Walking and Moving Around Current Status (H0865): At least 20 percent but less than 40 percent impaired, limited or restricted Mobility: Walking and Moving Around Goal Status [phone]): At least 1 percent but less than 20 percent impaired, limited or restricted    Time: 0842-0915 PT Time Calculation (min) (ACUTE ONLY): 33 min   Charges:   PT Evaluation $PT Eval Moderate Complexity: 1 Procedure PT Treatments $Therapeutic Activity: 8-22 mins   PT G Codes:   PT G-Codes **NOT FOR INPATIENT CLASS** Functional Assessment Tool Used: clinical judgement Functional Limitation: Mobility: Walking and moving around Mobility: Walking and Moving Around Current Status (G2952): At least 20 percent but less than 40 percent impaired, limited or restricted Mobility: Walking and Moving Around Goal Status [phone]): At least 1 percent but less than 20 percent impaired, limited or restricted    Davarious Tumbleson 08/01/2015, 10:59 AM  Elizabeth Palau, PT, DPT [phone]

## 2015-08-01 NOTE — Care Management Note (Signed)
Case Management Note  Patient Details  Name: Jackie Rangel MRN: [id number] Date of Birth: 1928/11/28  Subjective/Objective:                  Met with patient, husband and patient's daughter Jackie Rangel to discuss discharge planning. OBServation explained; form delivered. She will need rolling walker per her husband. She plans to return home at discharge and would like to use [place]. Her PCP is Dr. Emily Filbert. She uses [facility] for Rx.  Action/Plan: List of home health agencies delivered. Referral to [place] for rolling walker and HHPT. Dr. Darvin Neighbours notified by text. RNCM will continue to follow.   Expected Discharge Date:  08/02/15               Expected Discharge Plan:     In-House Referral:     Discharge planning Services  CM Consult  Post Acute Care Choice:  Home Health, Durable Medical Equipment Choice offered to:  Patient, Spouse, Adult Children  DME Arranged:  Walker rolling DME Agency:  [place]:  PT [facility] Agency:  [place]  Status of Service:  In process, will continue to follow  Medicare Important Message Given:    Date Medicare IM Given:    Medicare IM give by:    Date Additional Medicare IM Given:    Additional Medicare Important Message give by:     If discussed at [place] of Stay Meetings, dates discussed:    Additional Comments:  Jackie Garfinkel, RN 08/01/2015, 11:01 AM

## 2015-08-01 NOTE — Care Management (Signed)
Barbara CowerJason with [facility] notified of patient discharge. Patient may leave when rolling walker has been delivered also by [facility]. No further RNCM needs.

## 2015-08-01 NOTE — Care Management Obs Status (Signed)
MEDICARE OBSERVATION STATUS NOTIFICATION   Patient Details  Name: Carloyn JaegerHelen Barber Kenan MRN: [id number] Date of Birth: 05/05/1928   Medicare Observation Status Notification Given:  Yes    Collie Siadngela [place] Schweppe, RN 08/01/2015, 7:59 AM

## 2015-08-01 NOTE — Progress Notes (Signed)
DISCHARGE NOTE:  Pt given discharge instructions and prescription. Pt verbalized understanding. Pt wheeled to car by staff.  

## 2015-08-07 NOTE — Discharge Summary (Signed)
[facility] -  at [facility]   PATIENT NAME: Jackie Rangel    MR#:  [id number]  DATE OF BIRTH:  10/19/1928  DATE OF ADMISSION:  07/31/2015 ADMITTING PHYSICIAN: Enid Baas, MD  DATE OF DISCHARGE: 08/01/2015  4:22 PM  PRIMARY CARE PHYSICIAN: Danella Penton, MD   ADMISSION DIAGNOSIS:  Pain [R52] Fall, initial encounter [phone].[id number]] Multiple rib fractures, right, closed, initial encounter [S22.41XA]  DISCHARGE DIAGNOSIS:  Active Problems:   Rib fracture   SECONDARY DIAGNOSIS:   Past Medical History  Diagnosis Date  . Hypothyroidism   . COPD (chronic obstructive pulmonary disease) (HCC)     not on home o2  . Hypertension   . Peripheral neuropathy (HCC)   . Esophageal spasm   . TIA (transient ischemic attack)   . Osteopenia      ADMITTING HISTORY  Jackie Rangel is a 82 y.o. female with a known history of COPD not on oxygen, hypothyroidism, neuropathy, hypertension, osteopenia comes from home after a mechanical fall and noted to have right rib fractures. Patient has been in her normal state of health, was walking to the bathroom and had a mechanical fall while because of the shower door and the right side for chest hit a opened drawer. Since then she had intense sharp pain in the right side of her chest, unable to breathe and she thought she punctured a lung. Brought to the emergency room, sats are stable. X-ray revealing multiple right-sided rib fractures. Patient is requiring IV pain medications, couldn't even move without pain and is being admitted for pain control. Denies any recent fevers or chills. No cough congestion or nausea or vomiting. Denies any loss of consciousness or chest pain or lightheadedness prior to the fall.  HOSPITAL COURSE:   Jackie Rangel is a 82 y.o. female with a known history of COPD not on oxygen, hypothyroidism, neuropathy, hypertension, osteopenia comes from home after a mechanical fall and noted to have right rib  fractures.  1. Mechanical fall and right rib fractures-multiple rib fractures and right side. Known history of osteopenia. - continue calcium and vitamin D supplements. -Physical therapy consult. -Pain control and IV fluids. incentive spirometry Patient did well with pain control not needing any IV medications. Her pain is well controlled with oral pain medications. Works with physical therapy and was ambulated Home health has been set up at discharge.  Discussed with Dr. Karl Bales of surgery who suggested repeat chest x-ray in 1 week with primary care physician.  2. COPD-stable, not an oxygen. -Continue her inhalers.  3. Esophageal spasms-has been on Cardizem for a long time. Continued that and also on Protonix.  4. Hypertension-on propranolol and Also on Cardizem.  5. DVT prophylaxis-on Lovenox and hospital  6. Hyponatremia- Improved IV fluids  Discharged home in stable condition.  CONSULTS OBTAINED:  Treatment Team:  Ricarda Frame, MD  DRUG ALLERGIES:  No Known Allergies  DISCHARGE MEDICATIONS:   Discharge Medication List as of 08/01/2015  1:19 PM    START taking these medications   Details  oxyCODONE (OXY IR/ROXICODONE) 5 MG immediate release tablet Take 1 tablet (5 mg total) by mouth every 6 (six) hours as needed for moderate pain or severe pain., Starting 08/01/2015, Until Discontinued, Print      CONTINUE these medications which have NOT CHANGED   Details  ALPRAZolam (XANAX) 0.5 MG tablet Take 0.5 tablets by mouth at bedtime as needed., Starting 06/20/2015, Until Discontinued, Historical Med    budesonide-formoterol (SYMBICORT) 160-4.5 MCG/ACT inhaler  Inhale 2 puffs into the lungs 2 (two) times daily., Until Discontinued, Historical Med    calcium carbonate (CALTRATE 600) 1500 (600 Ca) MG TABS tablet Take 1 tablet by mouth daily., Until Discontinued, Historical Med    CARTIA XT 120 MG 24 hr capsule Take 1 capsule by mouth daily., Starting 05/07/2015, Until  Discontinued, Historical Med    clopidogrel (PLAVIX) 75 MG tablet Take 1 tablet by mouth daily., Starting 07/01/2015, Until Discontinued, Historical Med    esomeprazole (NEXIUM) 40 MG capsule Take 1 capsule by mouth daily., Starting 07/25/2015, Until Discontinued, Historical Med    fluticasone (FLONASE) 50 MCG/ACT nasal spray Place 2 sprays into the nose daily as needed., Starting 04/08/2015, Until Tue 04/07/16, Historical Med    hyoscyamine (LEVSIN, ANASPAZ) 0.125 MG tablet Take 1 tablet by mouth daily., Starting 05/13/2015, Until Discontinued, Historical Med    levothyroxine (SYNTHROID, LEVOTHROID) 112 MCG tablet Take 112 mcg by mouth daily before breakfast., Until Discontinued, Historical Med    montelukast (SINGULAIR) 10 MG tablet Take 1 tablet by mouth at bedtime., Starting 06/06/2015, Until Discontinued, Historical Med    polyethylene glycol powder (GLYCOLAX/MIRALAX) powder Take 17 g by mouth daily., Until Discontinued, Historical Med    propranolol (INDERAL) 20 MG tablet Take 1 tablet by mouth 2 (two) times daily., Starting 06/07/2015, Until Discontinued, Historical Med    raloxifene (EVISTA) 60 MG tablet Take 1 tablet by mouth daily., Starting 05/29/2015, Until Discontinued, Historical Med    simvastatin (ZOCOR) 20 MG tablet Take 1 tablet by mouth at bedtime., Starting 05/30/2015, Until Discontinued, Historical Med        Today   VITAL SIGNS:  Blood pressure 145/61, pulse 83, temperature 98.6 F (37 C), temperature source Oral, resp. rate 16, height 5\' 1"  (1.549 m), weight 61.689 kg (136 lb), SpO2 96 %.  I/O:  No intake or output data in the 24 hours ending 08/07/15 1632  PHYSICAL EXAMINATION:  Physical Exam  GENERAL:  82 y.o.-year-old patient lying in the bed with no acute distress.  LUNGS: Normal breath sounds bilaterally, no wheezing, rales,rhonchi or crepitation. No use of accessory muscles of respiration.  CARDIOVASCULAR: S1, S2 normal. No murmurs, rubs, or gallops.   ABDOMEN: Soft, non-tender, non-distended. Bowel sounds present. No organomegaly or mass.  NEUROLOGIC: Moves all 4 extremities. PSYCHIATRIC: The patient is alert and oriented x 3.  SKIN: No obvious rash, lesion, or ulcer.   DATA REVIEW:   CBC  Recent Labs Lab 08/01/15 0514  WBC 7.3  HGB 11.9*  HCT 35.3  PLT 230    Chemistries   Recent Labs Lab 08/01/15 0514  NA 136  K 4.2  CL 107  CO2 25  GLUCOSE 91  BUN 12  CREATININE 0.72  CALCIUM 8.0*    Cardiac Enzymes No results for input(s): TROPONINI in the last 168 hours.  Microbiology Results  No results found for this or any previous visit.  RADIOLOGY:  No results found.  Follow up with PCP in 1 week.  Management plans discussed with the patient, family and they are in agreement.  CODE STATUS:  Code Status History    Date Active Date Inactive Code Status Order ID Comments User Context   07/31/2015  4:41 PM 08/01/2015  7:22 PM Full Code [id number]  Enid Baasadhika Kalisetti, MD Inpatient    Advance Directive Documentation        Most Recent Value   Type of Advance Directive  Living will, Healthcare Power of Attorney   Pre-existing out of facility  DNR order (yellow form or pink MOST form)     "MOST" Form in Place?        TOTAL TIME TAKING CARE OF THIS PATIENT ON DAY OF DISCHARGE: more than 30 minutes.   Milagros Loll R M.D on 08/07/2015 at 4:32 PM  Between 7am to 6pm - Pager - [phone]  After 6pm go to [email or web address] - password [email or web address]  [facility]  Office  [phone]  CC: Primary care physician; Danella Penton, MD  Note: This dictation was prepared with Dragon dictation along with smaller phrase technology. Any transcriptional errors that result from this process are unintentional.

## 2015-08-13 ENCOUNTER — Other Ambulatory Visit: Payer: Self-pay | Admitting: Internal Medicine

## 2015-08-13 DIAGNOSIS — R102 Pelvic and perineal pain: Principal | ICD-10-CM

## 2015-08-13 DIAGNOSIS — G8929 Other chronic pain: Secondary | ICD-10-CM

## 2015-08-15 ENCOUNTER — Ambulatory Visit
Admission: RE | Admit: 2015-08-15 | Discharge: 2015-08-15 | Disposition: A | Discharge status: Home / Self Care | Payer: Medicare Other | Source: Ambulatory Visit | Attending: Internal Medicine | Admitting: Internal Medicine

## 2015-08-15 DIAGNOSIS — G8929 Other chronic pain: Secondary | ICD-10-CM | POA: Diagnosis present

## 2015-08-15 DIAGNOSIS — N949 Unspecified condition associated with female genital organs and menstrual cycle: Secondary | ICD-10-CM | POA: Insufficient documentation

## 2015-08-15 DIAGNOSIS — R102 Pelvic and perineal pain: Secondary | ICD-10-CM

## 2016-02-12 ENCOUNTER — Other Ambulatory Visit: Payer: Self-pay | Admitting: Internal Medicine

## 2016-02-12 DIAGNOSIS — Z1231 Encounter for screening mammogram for malignant neoplasm of breast: Secondary | ICD-10-CM

## 2016-03-25 ENCOUNTER — Ambulatory Visit: Payer: Medicare Other

## 2016-04-17 ENCOUNTER — Ambulatory Visit
Admission: RE | Admit: 2016-04-17 | Discharge: 2016-04-17 | Disposition: A | Discharge status: Home / Self Care | Payer: Medicare Other | Source: Ambulatory Visit | Attending: Internal Medicine | Admitting: Internal Medicine

## 2016-04-17 ENCOUNTER — Ambulatory Visit: Payer: Medicare Other

## 2016-04-17 DIAGNOSIS — Z1231 Encounter for screening mammogram for malignant neoplasm of breast: Secondary | ICD-10-CM

## 2017-03-01 ENCOUNTER — Encounter: Payer: Self-pay | Admitting: *Deleted

## 2017-03-08 ENCOUNTER — Encounter
Admission: RE | Disposition: A | Discharge status: Home / Self Care | Payer: Self-pay | Source: Ambulatory Visit | Attending: Ophthalmology

## 2017-03-08 ENCOUNTER — Ambulatory Visit
Admission: RE | Admit: 2017-03-08 | Discharge: 2017-03-08 | Disposition: A | Discharge status: Home / Self Care | Payer: Medicare Other | Source: Ambulatory Visit | Attending: Ophthalmology | Admitting: Ophthalmology

## 2017-03-08 ENCOUNTER — Ambulatory Visit: Payer: Medicare Other | Admitting: Anesthesiology

## 2017-03-08 ENCOUNTER — Encounter: Payer: Self-pay | Admitting: *Deleted

## 2017-03-08 ENCOUNTER — Other Ambulatory Visit: Payer: Self-pay

## 2017-03-08 DIAGNOSIS — K219 Gastro-esophageal reflux disease without esophagitis: Secondary | ICD-10-CM | POA: Insufficient documentation

## 2017-03-08 DIAGNOSIS — J449 Chronic obstructive pulmonary disease, unspecified: Secondary | ICD-10-CM | POA: Insufficient documentation

## 2017-03-08 DIAGNOSIS — M1990 Unspecified osteoarthritis, unspecified site: Secondary | ICD-10-CM | POA: Diagnosis not present

## 2017-03-08 DIAGNOSIS — Z8673 Personal history of transient ischemic attack (TIA), and cerebral infarction without residual deficits: Secondary | ICD-10-CM | POA: Diagnosis not present

## 2017-03-08 DIAGNOSIS — H2511 Age-related nuclear cataract, right eye: Secondary | ICD-10-CM | POA: Insufficient documentation

## 2017-03-08 DIAGNOSIS — E039 Hypothyroidism, unspecified: Secondary | ICD-10-CM | POA: Diagnosis not present

## 2017-03-08 DIAGNOSIS — Z79899 Other long term (current) drug therapy: Secondary | ICD-10-CM | POA: Diagnosis not present

## 2017-03-08 DIAGNOSIS — Z885 Allergy status to narcotic agent status: Secondary | ICD-10-CM | POA: Insufficient documentation

## 2017-03-08 DIAGNOSIS — Z888 Allergy status to other drugs, medicaments and biological substances status: Secondary | ICD-10-CM | POA: Diagnosis not present

## 2017-03-08 DIAGNOSIS — I10 Essential (primary) hypertension: Secondary | ICD-10-CM | POA: Diagnosis not present

## 2017-03-08 HISTORY — DX: Headache, unspecified: R51.9

## 2017-03-08 HISTORY — DX: Cardiac arrhythmia, unspecified: I49.9

## 2017-03-08 HISTORY — DX: Unspecified asthma, uncomplicated: J45.909

## 2017-03-08 HISTORY — DX: Headache: R51

## 2017-03-08 HISTORY — DX: Unspecified hearing loss, unspecified ear: H91.90

## 2017-03-08 HISTORY — DX: Unspecified osteoarthritis, unspecified site: M19.90

## 2017-03-08 HISTORY — PX: CATARACT EXTRACTION W/PHACO: SHX586

## 2017-03-08 SURGERY — PHACOEMULSIFICATION, CATARACT, WITH IOL INSERTION
Anesthesia: Monitor Anesthesia Care | Site: Eye | Laterality: Right | Wound class: Clean

## 2017-03-08 MED ORDER — TRYPAN BLUE 0.06 % OP SOLN
OPHTHALMIC | Status: AC
  Filled 2017-03-08: qty 0.5

## 2017-03-08 MED ORDER — NEOMYCIN-POLYMYXIN-DEXAMETH 0.1 % OP OINT
TOPICAL_OINTMENT | OPHTHALMIC | Status: DC | PRN
  Administered 2017-03-08: 1 via OPHTHALMIC

## 2017-03-08 MED ORDER — NA HYALUR & NA CHOND-NA HYALUR 0.4-0.35 ML IO KIT
PACK | INTRAOCULAR | Status: DC | PRN
  Administered 2017-03-08: .35 mL via INTRAOCULAR

## 2017-03-08 MED ORDER — LIDOCAINE HCL (PF) 4 % IJ SOLN
INTRAMUSCULAR | Status: AC
  Filled 2017-03-08: qty 5

## 2017-03-08 MED ORDER — MIDAZOLAM HCL 2 MG/2ML IJ SOLN
INTRAMUSCULAR | Status: DC | PRN
  Administered 2017-03-08: 1 mg via INTRAVENOUS

## 2017-03-08 MED ORDER — MOXIFLOXACIN HCL 0.5 % OP SOLN
OPHTHALMIC | Status: AC
  Administered 2017-03-08: 1 [drp] via OPHTHALMIC
  Filled 2017-03-08: qty 3

## 2017-03-08 MED ORDER — PROPOFOL 10 MG/ML IV BOLUS
INTRAVENOUS | Status: AC
  Filled 2017-03-08: qty 20

## 2017-03-08 MED ORDER — NEOMYCIN-POLYMYXIN-DEXAMETH 3.5-10000-0.1 OP OINT
TOPICAL_OINTMENT | OPHTHALMIC | Status: AC
  Filled 2017-03-08: qty 3.5

## 2017-03-08 MED ORDER — MOXIFLOXACIN HCL 0.5 % OP SOLN
OPHTHALMIC | Status: AC
  Filled 2017-03-08: qty 3

## 2017-03-08 MED ORDER — POVIDONE-IODINE 5 % OP SOLN
OPHTHALMIC | Status: DC | PRN
  Administered 2017-03-08: 1 via OPHTHALMIC

## 2017-03-08 MED ORDER — LIDOCAINE HCL (PF) 4 % IJ SOLN
INTRAOCULAR | Status: DC | PRN
  Administered 2017-03-08: 4 mL via OPHTHALMIC

## 2017-03-08 MED ORDER — FENTANYL CITRATE (PF) 100 MCG/2ML IJ SOLN
INTRAMUSCULAR | Status: DC | PRN
  Administered 2017-03-08: 25 ug via INTRAVENOUS

## 2017-03-08 MED ORDER — EPINEPHRINE PF 1 MG/ML IJ SOLN
INTRAMUSCULAR | Status: AC
  Filled 2017-03-08: qty 1

## 2017-03-08 MED ORDER — NA HYALUR & NA CHOND-NA HYALUR 0.55-0.5 ML IO KIT
PACK | INTRAOCULAR | Status: AC
  Filled 2017-03-08: qty 1.05

## 2017-03-08 MED ORDER — GLYCOPYRROLATE 0.2 MG/ML IJ SOLN
INTRAMUSCULAR | Status: AC
  Filled 2017-03-08: qty 1

## 2017-03-08 MED ORDER — SODIUM CHLORIDE 0.9 % IV SOLN
INTRAVENOUS | Status: DC
  Administered 2017-03-08: 08:00:00 via INTRAVENOUS

## 2017-03-08 MED ORDER — ARMC OPHTHALMIC DILATING DROPS
1.0000 "application " | OPHTHALMIC | Status: AC
  Administered 2017-03-08 (×2): 1 via OPHTHALMIC

## 2017-03-08 MED ORDER — MIDAZOLAM HCL 2 MG/2ML IJ SOLN
INTRAMUSCULAR | Status: AC
Start: 2017-03-08 — End: ?
  Filled 2017-03-08: qty 2

## 2017-03-08 MED ORDER — CARBACHOL 0.01 % IO SOLN
INTRAOCULAR | Status: DC | PRN
  Administered 2017-03-08: 0.5 mL via INTRAOCULAR

## 2017-03-08 MED ORDER — FENTANYL CITRATE (PF) 100 MCG/2ML IJ SOLN
INTRAMUSCULAR | Status: AC
  Filled 2017-03-08: qty 2

## 2017-03-08 MED ORDER — ARMC OPHTHALMIC DILATING DROPS
OPHTHALMIC | Status: AC
  Administered 2017-03-08: 1 via OPHTHALMIC
  Filled 2017-03-08: qty 0.4

## 2017-03-08 MED ORDER — MOXIFLOXACIN HCL 0.5 % OP SOLN
1.0000 [drp] | OPHTHALMIC | Status: DC | PRN
  Administered 2017-03-08: 1 [drp] via OPHTHALMIC

## 2017-03-08 MED ORDER — BSS IO SOLN
INTRAOCULAR | Status: DC | PRN
  Administered 2017-03-08: 200 mL via OPHTHALMIC

## 2017-03-08 SURGICAL SUPPLY — 16 items
GLOVE BIO SURGEON STRL SZ8 (GLOVE) ×2 IMPLANT
GLOVE BIOGEL M 6.5 STRL (GLOVE) ×2 IMPLANT
GLOVE SURG LX 7.5 STRW (GLOVE) ×1
GLOVE SURG LX STRL 7.5 STRW (GLOVE) ×1 IMPLANT
GOWN STRL REUS W/ TWL LRG LVL3 (GOWN DISPOSABLE) ×2 IMPLANT
GOWN STRL REUS W/TWL LRG LVL3 (GOWN DISPOSABLE) ×2
LABEL CATARACT MEDS ST (LABEL) ×2 IMPLANT
LENS IOL TECNIS ITEC 21.0 (Intraocular Lens) ×2 IMPLANT
PACK CATARACT (MISCELLANEOUS) ×2 IMPLANT
PACK CATARACT BRASINGTON LX (MISCELLANEOUS) ×2 IMPLANT
PACK EYE AFTER SURG (MISCELLANEOUS) ×2 IMPLANT
SOL BSS BAG (MISCELLANEOUS) ×2
SOLUTION BSS BAG (MISCELLANEOUS) ×1 IMPLANT
SYR 5ML LL (SYRINGE) ×2 IMPLANT
WATER STERILE IRR 250ML POUR (IV SOLUTION) ×2 IMPLANT
WIPE NON LINTING 3.25X3.25 (MISCELLANEOUS) ×2 IMPLANT

## 2017-03-08 NOTE — Anesthesia Postprocedure Evaluation (Signed)
Anesthesia Post Note  Patient: Jackie Rangel  Procedure(s) Performed: CATARACT EXTRACTION PHACO AND INTRAOCULAR LENS PLACEMENT ([place]) (Right Eye)  Patient location during evaluation: PACU Anesthesia Type: MAC Level of consciousness: awake, awake and alert and oriented Pain management: pain level controlled Vital Signs Assessment: post-procedure vital signs reviewed and stable Respiratory status: spontaneous breathing Cardiovascular status: blood pressure returned to baseline Postop Assessment: no apparent nausea or vomiting Anesthetic complications: no     Last Vitals:  Vitals:   03/08/17 0621 03/08/17 0803  BP: (!) 142/50 (!) 120/47  Pulse: 66   Resp: 18   Temp: 36.6 C (!) 35.8 C  SpO2: 100% 100%    Last Pain:  Vitals:   03/08/17 0803  TempSrc: Tympanic  PainSc:                  Hedda Slade

## 2017-03-08 NOTE — H&P (Signed)
The History and Physical notes are on paper, have been signed, and are to be scanned. The patient remains stable and unchanged from the H&P.   Previous H&P reviewed, patient examined, and there are no changes.  Jackie Rangel 03/08/2017 7:42 AM   

## 2017-03-08 NOTE — Transfer of Care (Signed)
Immediate Anesthesia Transfer of Care Note  Patient: Jackie Rangel  Procedure(s) Performed: CATARACT EXTRACTION PHACO AND INTRAOCULAR LENS PLACEMENT ([facility]) (Right Eye)  Patient Location: PACU  Anesthesia Type:MAC  Level of Consciousness: awake, alert  and oriented  Airway & Oxygen Therapy: Patient Spontanous Breathing  Post-op Assessment: Report given to RN and Post -op Vital signs reviewed and stable  Post vital signs: Reviewed and stable  Last Vitals:  Vitals:   03/08/17 0621 03/08/17 0803  BP: (!) 142/50 (!) 120/47  Pulse: 66   Resp: 18   Temp: 36.6 C (!) 35.8 C  SpO2: 100% 100%    Last Pain:  Vitals:   03/08/17 0803  TempSrc: Tympanic  PainSc:          Complications: No apparent anesthesia complications

## 2017-03-08 NOTE — Discharge Instructions (Signed)

## 2017-03-08 NOTE — Anesthesia Preprocedure Evaluation (Signed)
Anesthesia Evaluation  Patient identified by MRN, date of birth, ID band Patient awake    Reviewed: Allergy & Precautions, NPO status , Patient's Chart, lab work & pertinent test results, reviewed documented beta blocker date and time   Airway Mallampati: II  TM Distance: >3 FB     Dental  (+) Chipped   Pulmonary asthma , COPD,           Cardiovascular hypertension, Pt. on medications and Pt. on home beta blockers + dysrhythmias      Neuro/Psych  Headaches, TIA Neuromuscular disease    GI/Hepatic   Endo/Other  Hypothyroidism   Renal/GU      Musculoskeletal  (+) Arthritis ,   Abdominal   Peds  Hematology   Anesthesia Other Findings   Reproductive/Obstetrics                             Anesthesia Physical Anesthesia Plan  ASA: III  Anesthesia Plan: MAC   Post-op Pain Management:    Induction:   PONV Risk Score and Plan:   Airway Management Planned:   Additional Equipment:   Intra-op Plan:   Post-operative Plan:   Informed Consent: I have reviewed the patients History and Physical, chart, labs and discussed the procedure including the risks, benefits and alternatives for the proposed anesthesia with the patient or authorized representative who has indicated his/her understanding and acceptance.     Plan Discussed with: CRNA  Anesthesia Plan Comments:         Anesthesia Quick Evaluation

## 2017-03-08 NOTE — Op Note (Signed)
OPERATIVE NOTE  Jackie JaegerHelen Barber Rangel [id number] 03/08/2017   PREOPERATIVE DIAGNOSIS:  Nuclear Sclerotic Cataract Right Eye H25.11   POSTOPERATIVE DIAGNOSIS: Nuclear Sclerotic Cataract Right Eye H25.11          PROCEDURE:  Phacoemusification with posterior chamber intraocular lens placement of the right eye   LENS:   Implant Name Type Inv. Item Serial No. Manufacturer Lot No. LRB No. Used  LENS IOL DIOP 21.0 - [id number] Intraocular Lens LENS IOL DIOP 21.0 [id number]  Right 1       ULTRASOUND TIME: 11 %  of 0 minutes 54 seconds, CDE 5.7  SURGEON:  Deirdre Evenerhadwick R. Ibrahim Mcpheeters, MD   ANESTHESIA:  Topical with tetracaine drops and 2% Xylocaine jelly, augmented with 1% preservative-free intracameral lidocaine.    COMPLICATIONS:  None.   DESCRIPTION OF PROCEDURE:  The patient was identified in the holding room and transported to the operating room and placed in the supine position under the operating microscope. Theright eye was identified as the operative eye and it was prepped and draped in the usual sterile ophthalmic fashion.   A 1 millimeter clear-corneal paracentesis was made at the 12:00 position.  0.5 ml of preservative-free 1% lidocaine was injected into the anterior chamber. The anterior chamber was filled with Viscoat viscoelastic.  A 2.4 millimeter keratome was used to make a near-clear corneal incision at the 9:00 position. A curvilinear capsulorrhexis was made with a cystotome and capsulorrhexis forceps.  Balanced salt solution was used to hydrodissect and hydrodelineate the nucleus.   Phacoemulsification was then used in stop and chop fashion to remove the lens nucleus and epinucleus.  The remaining cortex was then removed using the irrigation and aspiration handpiece. Provisc was then placed into the capsular bag to distend it for lens placement.  A lens was then injected into the capsular bag.  The remaining viscoelastic was aspirated.  Wounds were hydrated with balanced  salt solution.  The anterior chamber was inflated to a physiologic pressure with balanced salt solution. Vigamox 0.2 ml of a 1mg  per ml solution was injected into the anterior chamber for a dose of 0.2 mg of intracameral antibiotic at the completion of the case. Miostat was placed into the anterior chamber to constrict the pupil.  No wound leaks were noted.  Topical Vigamox drops and Maxitrol ointment were applied to the eye.  The patient was taken to the recovery room in stable condition without complications of anesthesia or surgery.  Jackie Rangel 03/08/2017, 8:02 AM

## 2017-03-08 NOTE — Anesthesia Post-op Follow-up Note (Signed)
Anesthesia QCDR form completed.        

## 2017-04-06 ENCOUNTER — Encounter: Payer: Self-pay | Admitting: *Deleted

## 2017-04-14 ENCOUNTER — Encounter: Payer: Self-pay | Admitting: *Deleted

## 2017-04-14 ENCOUNTER — Ambulatory Visit
Admission: RE | Admit: 2017-04-14 | Discharge: 2017-04-14 | Disposition: A | Discharge status: Home / Self Care | Payer: Medicare Other | Source: Ambulatory Visit | Attending: Ophthalmology | Admitting: Ophthalmology

## 2017-04-14 ENCOUNTER — Ambulatory Visit: Payer: Medicare Other | Admitting: Anesthesiology

## 2017-04-14 ENCOUNTER — Other Ambulatory Visit: Payer: Self-pay

## 2017-04-14 ENCOUNTER — Encounter
Admission: RE | Disposition: A | Discharge status: Home / Self Care | Payer: Self-pay | Source: Ambulatory Visit | Attending: Ophthalmology

## 2017-04-14 DIAGNOSIS — Z8673 Personal history of transient ischemic attack (TIA), and cerebral infarction without residual deficits: Secondary | ICD-10-CM | POA: Diagnosis not present

## 2017-04-14 DIAGNOSIS — Z7902 Long term (current) use of antithrombotics/antiplatelets: Secondary | ICD-10-CM | POA: Diagnosis not present

## 2017-04-14 DIAGNOSIS — Z888 Allergy status to other drugs, medicaments and biological substances status: Secondary | ICD-10-CM | POA: Insufficient documentation

## 2017-04-14 DIAGNOSIS — I10 Essential (primary) hypertension: Secondary | ICD-10-CM | POA: Insufficient documentation

## 2017-04-14 DIAGNOSIS — E7800 Pure hypercholesterolemia, unspecified: Secondary | ICD-10-CM | POA: Diagnosis not present

## 2017-04-14 DIAGNOSIS — M1990 Unspecified osteoarthritis, unspecified site: Secondary | ICD-10-CM | POA: Diagnosis not present

## 2017-04-14 DIAGNOSIS — J449 Chronic obstructive pulmonary disease, unspecified: Secondary | ICD-10-CM | POA: Diagnosis not present

## 2017-04-14 DIAGNOSIS — Z79899 Other long term (current) drug therapy: Secondary | ICD-10-CM | POA: Diagnosis not present

## 2017-04-14 DIAGNOSIS — M8580 Other specified disorders of bone density and structure, unspecified site: Secondary | ICD-10-CM | POA: Diagnosis not present

## 2017-04-14 DIAGNOSIS — H2512 Age-related nuclear cataract, left eye: Secondary | ICD-10-CM | POA: Diagnosis present

## 2017-04-14 DIAGNOSIS — G629 Polyneuropathy, unspecified: Secondary | ICD-10-CM | POA: Diagnosis not present

## 2017-04-14 DIAGNOSIS — K219 Gastro-esophageal reflux disease without esophagitis: Secondary | ICD-10-CM | POA: Diagnosis not present

## 2017-04-14 DIAGNOSIS — E039 Hypothyroidism, unspecified: Secondary | ICD-10-CM | POA: Diagnosis not present

## 2017-04-14 DIAGNOSIS — F419 Anxiety disorder, unspecified: Secondary | ICD-10-CM | POA: Insufficient documentation

## 2017-04-14 HISTORY — DX: Edema, unspecified: R60.9

## 2017-04-14 HISTORY — DX: Gastro-esophageal reflux disease without esophagitis: K21.9

## 2017-04-14 HISTORY — DX: Anxiety disorder, unspecified: F41.9

## 2017-04-14 HISTORY — PX: CATARACT EXTRACTION W/PHACO: SHX586

## 2017-04-14 SURGERY — PHACOEMULSIFICATION, CATARACT, WITH IOL INSERTION
Anesthesia: Monitor Anesthesia Care | Site: Eye | Laterality: Left | Wound class: Clean

## 2017-04-14 MED ORDER — CARBACHOL 0.01 % IO SOLN
INTRAOCULAR | Status: DC | PRN
  Administered 2017-04-14: .5 mL via INTRAOCULAR

## 2017-04-14 MED ORDER — MIDAZOLAM HCL 2 MG/2ML IJ SOLN
INTRAMUSCULAR | Status: AC
Start: 2017-04-14 — End: ?
  Filled 2017-04-14: qty 2

## 2017-04-14 MED ORDER — BSS IO SOLN
INTRAOCULAR | Status: DC | PRN
  Administered 2017-04-14: 1 mL via OPHTHALMIC

## 2017-04-14 MED ORDER — NEOMYCIN-POLYMYXIN-DEXAMETH 3.5-10000-0.1 OP OINT
TOPICAL_OINTMENT | OPHTHALMIC | Status: AC
  Filled 2017-04-14: qty 3.5

## 2017-04-14 MED ORDER — MIDAZOLAM HCL 5 MG/5ML IJ SOLN
INTRAMUSCULAR | Status: DC | PRN
  Administered 2017-04-14: 1 mg via INTRAVENOUS

## 2017-04-14 MED ORDER — POVIDONE-IODINE 5 % OP SOLN
OPHTHALMIC | Status: DC | PRN
  Administered 2017-04-14: 1 via OPHTHALMIC

## 2017-04-14 MED ORDER — EPINEPHRINE PF 1 MG/ML IJ SOLN
INTRAMUSCULAR | Status: AC
  Filled 2017-04-14: qty 1

## 2017-04-14 MED ORDER — NEOMYCIN-POLYMYXIN-DEXAMETH 0.1 % OP OINT
TOPICAL_OINTMENT | OPHTHALMIC | Status: DC | PRN
  Administered 2017-04-14: 1 via OPHTHALMIC

## 2017-04-14 MED ORDER — MOXIFLOXACIN HCL 0.5 % OP SOLN: 1.0000 [drp] | Freq: Once | OPHTHALMIC | Status: DC

## 2017-04-14 MED ORDER — LIDOCAINE HCL (PF) 4 % IJ SOLN
INTRAOCULAR | Status: DC | PRN
  Administered 2017-04-14: 2 mL via OPHTHALMIC

## 2017-04-14 MED ORDER — ARMC OPHTHALMIC DILATING DROPS
OPHTHALMIC | Status: AC
  Filled 2017-04-14: qty 0.4

## 2017-04-14 MED ORDER — MOXIFLOXACIN HCL 0.5 % OP SOLN
OPHTHALMIC | Status: AC
  Filled 2017-04-14: qty 6

## 2017-04-14 MED ORDER — NA CHONDROIT SULF-NA HYALURON 40-17 MG/ML IO SOLN
INTRAOCULAR | Status: AC
  Filled 2017-04-14: qty 1

## 2017-04-14 MED ORDER — MOXIFLOXACIN HCL 0.5 % OP SOLN
1.0000 [drp] | OPHTHALMIC | Status: AC
  Administered 2017-04-14 (×3): 1 [drp] via OPHTHALMIC

## 2017-04-14 MED ORDER — POVIDONE-IODINE 5 % OP SOLN
OPHTHALMIC | Status: AC
  Filled 2017-04-14: qty 30

## 2017-04-14 MED ORDER — FENTANYL CITRATE (PF) 100 MCG/2ML IJ SOLN
INTRAMUSCULAR | Status: AC
  Filled 2017-04-14: qty 2

## 2017-04-14 MED ORDER — FENTANYL CITRATE (PF) 100 MCG/2ML IJ SOLN
INTRAMUSCULAR | Status: DC | PRN
  Administered 2017-04-14: 25 ug via INTRAVENOUS

## 2017-04-14 MED ORDER — ARMC OPHTHALMIC DILATING DROPS
1.0000 "application " | OPHTHALMIC | Status: AC
  Administered 2017-04-14 (×3): 1 via OPHTHALMIC

## 2017-04-14 MED ORDER — NA HYALUR & NA CHOND-NA HYALUR 0.55-0.5 ML IO KIT
PACK | INTRAOCULAR | Status: AC
  Filled 2017-04-14: qty 1.05

## 2017-04-14 MED ORDER — MOXIFLOXACIN HCL 0.5 % OP SOLN
OPHTHALMIC | Status: DC | PRN
  Administered 2017-04-14: .2 mL via OPHTHALMIC

## 2017-04-14 MED ORDER — NA HYALUR & NA CHOND-NA HYALUR 0.4-0.35 ML IO KIT
PACK | INTRAOCULAR | Status: DC | PRN
  Administered 2017-04-14: 1 mL via INTRAOCULAR

## 2017-04-14 MED ORDER — LIDOCAINE HCL (PF) 4 % IJ SOLN
INTRAMUSCULAR | Status: AC
  Filled 2017-04-14: qty 5

## 2017-04-14 MED ORDER — SODIUM CHLORIDE 0.9 % IV SOLN
INTRAVENOUS | Status: DC
  Administered 2017-04-14 (×2): via INTRAVENOUS

## 2017-04-14 SURGICAL SUPPLY — 16 items
GLOVE BIO SURGEON STRL SZ8 (GLOVE) ×2 IMPLANT
GLOVE BIOGEL M 6.5 STRL (GLOVE) ×2 IMPLANT
GLOVE SURG LX 7.5 STRW (GLOVE) ×1
GLOVE SURG LX STRL 7.5 STRW (GLOVE) ×1 IMPLANT
GOWN STRL REUS W/ TWL LRG LVL3 (GOWN DISPOSABLE) ×2 IMPLANT
GOWN STRL REUS W/TWL LRG LVL3 (GOWN DISPOSABLE) ×2
LABEL CATARACT MEDS ST (LABEL) ×2 IMPLANT
LENS IOL TECNIS ITEC 22.0 (Intraocular Lens) ×2 IMPLANT
PACK CATARACT (MISCELLANEOUS) ×2 IMPLANT
PACK CATARACT BRASINGTON LX (MISCELLANEOUS) ×2 IMPLANT
PACK EYE AFTER SURG (MISCELLANEOUS) ×2 IMPLANT
SOL BSS BAG (MISCELLANEOUS) ×2
SOLUTION BSS BAG (MISCELLANEOUS) ×1 IMPLANT
SYR 5ML LL (SYRINGE) ×2 IMPLANT
WATER STERILE IRR 250ML POUR (IV SOLUTION) ×2 IMPLANT
WIPE NON LINTING 3.25X3.25 (MISCELLANEOUS) ×2 IMPLANT

## 2017-04-14 NOTE — Anesthesia Preprocedure Evaluation (Signed)
Anesthesia Evaluation  Patient identified by MRN, date of birth, ID band Patient awake    Reviewed: Allergy & Precautions, NPO status , Patient's Chart, lab work & pertinent test results  History of Anesthesia Complications Negative for: history of anesthetic complications  Airway Mallampati: II  TM Distance: >3 FB Neck ROM: Full    Dental no notable dental hx.    Pulmonary asthma , COPD,    breath sounds clear to auscultation- rhonchi (-) wheezing      Cardiovascular hypertension, Pt. on medications (-) CAD, (-) Past MI, (-) Cardiac Stents and (-) CABG  Rhythm:Regular Rate:Normal - Systolic murmurs and - Diastolic murmurs    Neuro/Psych  Headaches, Anxiety TIA   GI/Hepatic Neg liver ROS, GERD  ,  Endo/Other  neg diabetesHypothyroidism   Renal/GU negative Renal ROS     Musculoskeletal  (+) Arthritis ,   Abdominal (+) - obese,   Peds  Hematology negative hematology ROS (+)   Anesthesia Other Findings Past Medical History: No date: Anxiety No date: Arthritis No date: Asthma No date: COPD (chronic obstructive pulmonary disease) (HCC)     Comment:  not on home o2 No date: Dysrhythmia No date: Edema     Comment:  ANKLES No date: Esophageal spasm No date: GERD (gastroesophageal reflux disease) No date: Headache     Comment:  MIGRAINES No date: HOH (hard of hearing)     Comment:  AIDS No date: Hypertension No date: Hypothyroidism No date: Osteopenia No date: Peripheral neuropathy No date: TIA (transient ischemic attack)   Reproductive/Obstetrics                             Anesthesia Physical Anesthesia Plan  ASA: III  Anesthesia Plan: MAC   Post-op Pain Management:    Induction: Intravenous  PONV Risk Score and Plan: 2 and Midazolam  Airway Management Planned: Natural Airway  Additional Equipment:   Intra-op Plan:   Post-operative Plan:   Informed Consent: I have  reviewed the patients History and Physical, chart, labs and discussed the procedure including the risks, benefits and alternatives for the proposed anesthesia with the patient or authorized representative who has indicated his/her understanding and acceptance.     Plan Discussed with: CRNA and Anesthesiologist  Anesthesia Plan Comments:         Anesthesia Quick Evaluation

## 2017-04-14 NOTE — Discharge Instructions (Signed)
FOLLOW DR. Skip EstimableBRASINGTON'S EYE DROP SHEET AS REVIEWED.   Eye Surgery Discharge Instructions  Expect mild scratchy sensation or mild soreness. DO NOT RUB YOUR EYE!  The day of surgery:  Minimal physical activity, but bed rest is not required  No reading, computer work, or close hand work  No bending, lifting, or straining.  May watch TV  For 24 hours:  No driving, legal decisions, or alcoholic beverages  Safety precautions  Eat anything you prefer: It is better to start with liquids, then soup then solid foods.  _____ Eye patch should be worn until postoperative exam tomorrow.  ____ Solar shield eyeglasses should be worn for comfort in the sunlight/patch while sleeping  Resume all regular medications including aspirin or Coumadin if these were discontinued prior to surgery. You may shower, bathe, shave, or wash your hair. Tylenol may be taken for mild discomfort.  Call your doctor if you experience significant pain, nausea, or vomiting, fever > 101 or other signs of infection. [phone] or [phone] Specific instructions:  Follow-up Information    Lockie MolaBrasington, Chadwick, MD Follow up.   Specialty:  Ophthalmology Why:  04/15/17 @ 11:10 am  Contact information: [address]   [place] [id number] [phone]

## 2017-04-14 NOTE — Anesthesia Post-op Follow-up Note (Signed)
Anesthesia QCDR form completed.        

## 2017-04-14 NOTE — Op Note (Signed)
OPERATIVE NOTE  Carloyn JaegerHelen Barber [place]ajas [id number] 04/14/2017   PREOPERATIVE DIAGNOSIS:  Nuclear sclerotic cataract left eye. H25.12   POSTOPERATIVE DIAGNOSIS:    Nuclear sclerotic cataract left eye.     PROCEDURE:  Phacoemusification with posterior chamber intraocular lens placement of the left eye   LENS:   Implant Name Type Inv. Item Serial No. Manufacturer Lot No. LRB No. Used  LENS IOL DIOP 22.0 - [id number] Intraocular Lens LENS IOL DIOP 22.0 [id number] AMO  Left 1        ULTRASOUND TIME: 14 % of 0 minutes, 43 seconds.  CDE 6.1   SURGEON:  Deirdre Evenerhadwick R. Brodi Kari, MD   ANESTHESIA:  Topical with tetracaine drops and 2% Xylocaine jelly, augmented with 1% preservative-free intracameral lidocaine.    COMPLICATIONS:  None.   DESCRIPTION OF PROCEDURE:  The patient was identified in the holding room and transported to the operating room and placed in the supine position under the operating microscope.  The left eye was identified as the operative eye and it was prepped and draped in the usual sterile ophthalmic fashion.   A 1 millimeter clear-corneal paracentesis was made at the 1:30 position. 0.5 ml of preservative-free 1% lidocaine was injected into the anterior chamber.  The anterior chamber was filled with Viscoat viscoelastic.  A 2.4 millimeter keratome was used to make a near-clear corneal incision at the 10:30 position.  .  A curvilinear capsulorrhexis was made with a cystotome and capsulorrhexis forceps.  Balanced salt solution was used to hydrodissect and hydrodelineate the nucleus.   Phacoemulsification was then used in stop and chop fashion to remove the lens nucleus and epinucleus.  The remaining cortex was then removed using the irrigation and aspiration handpiece. Provisc was then placed into the capsular bag to distend it for lens placement.  A lens was then injected into the capsular bag.  The remaining viscoelastic was aspirated.   Wounds were hydrated with balanced salt  solution.  The anterior chamber was inflated to a physiologic pressure with balanced salt solution. Vigamox 0.2 ml of a 1mg  per ml solution was injected into the anterior chamber for a dose of 0.2 mg of intracameral antibiotic at the completion of the case.  Miostat was placed into the anterior chamber to constrict the pupil.  No wound leaks were noted.  Topical Vigamox drops and Maxitrol ointment were applied to the eye.  The patient was taken to the recovery room in stable condition without complications of anesthesia or surgery  Freddie Dymek 04/14/2017, 8:21 AM

## 2017-04-14 NOTE — Anesthesia Postprocedure Evaluation (Signed)
Anesthesia Post Note  Patient: Jackie Rangel  Procedure(s) Performed: CATARACT EXTRACTION PHACO AND INTRAOCULAR LENS PLACEMENT ([place]) (Left Eye)  Patient location during evaluation: PACU Anesthesia Type: MAC Level of consciousness: awake and alert Pain management: pain level controlled Vital Signs Assessment: post-procedure vital signs reviewed and stable Respiratory status: spontaneous breathing, nonlabored ventilation and respiratory function stable Cardiovascular status: stable and blood pressure returned to baseline Postop Assessment: no apparent nausea or vomiting Anesthetic complications: no     Last Vitals:  Vitals:   04/14/17 0825 04/14/17 0830  BP: (!) 127/56 (!) 138/55  Pulse: 72 71  Resp:  16  Temp: 36.6 C   SpO2: 100% 99%    Last Pain:  Vitals:   04/14/17 0825  TempSrc: Oral                 Silvana Newness A

## 2017-04-14 NOTE — Transfer of Care (Signed)
Immediate Anesthesia Transfer of Care Note  Patient: Jackie Rangel  Procedure(s) Performed: CATARACT EXTRACTION PHACO AND INTRAOCULAR LENS PLACEMENT (I[facility]) (Left Eye)  Patient Location: PACU  Anesthesia Type:MAC  Level of Consciousness: awake, oriented and patient cooperative  Airway & Oxygen Therapy: Patient Spontanous Breathing  Post-op Assessment: Report given to RN, Post -op Vital signs reviewed and stable and Patient moving all extremities X 4  Post vital signs: Reviewed and stable  Last Vitals:  Vitals:   04/14/17 0642 04/14/17 0644  BP: (!) 143/68   Resp: 16   Temp:  (!) 36.1 C  SpO2: 98%     Last Pain:  Vitals:   04/14/17 0644  TempSrc: Temporal         Complications: No apparent anesthesia complications

## 2017-04-14 NOTE — H&P (Signed)
The History and Physical notes are on paper, have been signed, and are to be scanned. The patient remains stable and unchanged from the H&P.   Previous H&P reviewed, patient examined, and there are no changes.  Jackie Rangel 04/14/2017 7:35 AM

## 2017-05-24 ENCOUNTER — Other Ambulatory Visit: Payer: Self-pay | Admitting: Internal Medicine

## 2017-05-24 DIAGNOSIS — Z1231 Encounter for screening mammogram for malignant neoplasm of breast: Secondary | ICD-10-CM

## 2017-06-11 ENCOUNTER — Ambulatory Visit
Admission: RE | Admit: 2017-06-11 | Discharge: 2017-06-11 | Disposition: A | Discharge status: Home / Self Care | Payer: Medicare Other | Source: Ambulatory Visit | Attending: Internal Medicine | Admitting: Internal Medicine

## 2017-06-11 DIAGNOSIS — Z1231 Encounter for screening mammogram for malignant neoplasm of breast: Secondary | ICD-10-CM

## 2018-04-26 ENCOUNTER — Other Ambulatory Visit: Payer: Self-pay | Admitting: Obstetrics and Gynecology

## 2018-04-26 DIAGNOSIS — Z1231 Encounter for screening mammogram for malignant neoplasm of breast: Secondary | ICD-10-CM

## 2018-08-19 ENCOUNTER — Other Ambulatory Visit: Payer: Self-pay

## 2018-08-19 ENCOUNTER — Ambulatory Visit
Admission: RE | Admit: 2018-08-19 | Discharge: 2018-08-19 | Disposition: A | Discharge status: Home / Self Care | Payer: Medicare Other | Source: Ambulatory Visit | Attending: Obstetrics and Gynecology | Admitting: Obstetrics and Gynecology

## 2018-08-19 DIAGNOSIS — Z1231 Encounter for screening mammogram for malignant neoplasm of breast: Secondary | ICD-10-CM

## 2018-08-23 ENCOUNTER — Other Ambulatory Visit: Payer: Self-pay | Admitting: Obstetrics and Gynecology

## 2018-08-23 DIAGNOSIS — N6320 Unspecified lump in the left breast, unspecified quadrant: Secondary | ICD-10-CM

## 2018-08-23 DIAGNOSIS — R928 Other abnormal and inconclusive findings on diagnostic imaging of breast: Secondary | ICD-10-CM

## 2018-08-31 ENCOUNTER — Ambulatory Visit
Admission: RE | Admit: 2018-08-31 | Discharge: 2018-08-31 | Disposition: A | Discharge status: Home / Self Care | Payer: Medicare Other | Source: Ambulatory Visit | Attending: Obstetrics and Gynecology | Admitting: Obstetrics and Gynecology

## 2018-08-31 ENCOUNTER — Other Ambulatory Visit: Payer: Self-pay

## 2018-08-31 DIAGNOSIS — N6320 Unspecified lump in the left breast, unspecified quadrant: Secondary | ICD-10-CM | POA: Diagnosis present

## 2018-08-31 DIAGNOSIS — R928 Other abnormal and inconclusive findings on diagnostic imaging of breast: Secondary | ICD-10-CM | POA: Diagnosis present

## 2019-05-08 ENCOUNTER — Other Ambulatory Visit: Payer: Self-pay | Admitting: Obstetrics and Gynecology

## 2019-05-08 DIAGNOSIS — Z1231 Encounter for screening mammogram for malignant neoplasm of breast: Secondary | ICD-10-CM

## 2019-08-21 ENCOUNTER — Ambulatory Visit
Admission: RE | Admit: 2019-08-21 | Discharge: 2019-08-21 | Disposition: A | Discharge status: Home / Self Care | Payer: Medicare Other | Source: Ambulatory Visit | Attending: Obstetrics and Gynecology | Admitting: Obstetrics and Gynecology

## 2019-08-21 DIAGNOSIS — Z1231 Encounter for screening mammogram for malignant neoplasm of breast: Secondary | ICD-10-CM | POA: Diagnosis present

## 2019-12-25 ENCOUNTER — Ambulatory Visit: Payer: Medicare Other | Attending: Internal Medicine

## 2019-12-25 DIAGNOSIS — Z23 Encounter for immunization: Secondary | ICD-10-CM

## 2019-12-25 NOTE — Progress Notes (Signed)
° °  Covid-19 Vaccination Clinic  Name:  Jaunita Mikels    MRN: [id number] DOB: 04-Dec-1928  12/25/2019  Ms. Gillihan was observed post Covid-19 immunization for 15 minutes without incident. She was provided with Vaccine Information Sheet and instruction to access the V-Safe system.   Ms. Friar was instructed to call 911 with any severe reactions post vaccine:  Difficulty breathing   Swelling of face and throat   A fast heartbeat   A bad rash all over body   Dizziness and weakness

## 2020-07-15 ENCOUNTER — Other Ambulatory Visit: Payer: Self-pay | Admitting: Internal Medicine

## 2020-07-15 DIAGNOSIS — Z1231 Encounter for screening mammogram for malignant neoplasm of breast: Secondary | ICD-10-CM

## 2020-08-21 ENCOUNTER — Other Ambulatory Visit: Payer: Self-pay

## 2020-08-21 ENCOUNTER — Ambulatory Visit
Admission: RE | Admit: 2020-08-21 | Discharge: 2020-08-21 | Disposition: A | Discharge status: Home / Self Care | Payer: Medicare Other | Source: Ambulatory Visit | Attending: Internal Medicine | Admitting: Internal Medicine

## 2020-08-21 DIAGNOSIS — Z1231 Encounter for screening mammogram for malignant neoplasm of breast: Secondary | ICD-10-CM

## 2020-09-11 ENCOUNTER — Other Ambulatory Visit: Payer: Self-pay | Admitting: Internal Medicine

## 2020-09-11 ENCOUNTER — Ambulatory Visit
Admission: RE | Admit: 2020-09-11 | Discharge: 2020-09-11 | Disposition: A | Discharge status: Home / Self Care | Payer: Medicare Other | Source: Ambulatory Visit | Attending: Internal Medicine | Admitting: Internal Medicine

## 2020-09-11 ENCOUNTER — Other Ambulatory Visit: Payer: Self-pay

## 2020-09-11 DIAGNOSIS — G9340 Encephalopathy, unspecified: Secondary | ICD-10-CM | POA: Diagnosis not present

## 2020-11-19 ENCOUNTER — Ambulatory Visit (INDEPENDENT_AMBULATORY_CARE_PROVIDER_SITE_OTHER): Payer: Medicare Other | Admitting: Urology

## 2020-11-19 ENCOUNTER — Ambulatory Visit: Payer: Medicare Other | Admitting: Urology

## 2020-11-19 ENCOUNTER — Other Ambulatory Visit: Payer: Self-pay | Admitting: Otolaryngology

## 2020-11-19 ENCOUNTER — Other Ambulatory Visit: Payer: Self-pay

## 2020-11-19 ENCOUNTER — Encounter: Payer: Self-pay | Admitting: Urology

## 2020-11-19 VITALS — BP 126/66 | HR 61 | Ht 60.0 in | Wt 135.0 lb

## 2020-11-19 DIAGNOSIS — N390 Urinary tract infection, site not specified: Secondary | ICD-10-CM

## 2020-11-19 DIAGNOSIS — R42 Dizziness and giddiness: Secondary | ICD-10-CM

## 2020-11-19 LAB — BLADDER SCAN AMB NON-IMAGING: Scan Result: 10

## 2020-11-19 NOTE — Progress Notes (Signed)
In and Out Catheterization  Patient is present today for a I & O catheterization due to clean catch UA. Patient was cleaned and prepped in a sterile fashion with betadine . A 16FR cath was inserted no complications were noted , 83ml of urine return was noted, urine was bright yellow in color. A clean urine sample was collected for UA. Bladder was drained  And catheter was removed with out difficulty.    Preformed by: Veneta Penton, rma  Follow up/ Additional notes: as directed.

## 2020-11-19 NOTE — Progress Notes (Signed)
11/19/20 10:38 AM   Jackie Rangel June 19, 1928 [id number]  Referring provider:  Danella Penton, MD [phone] [facility] [address] [facility] [place],  [place] [address] Chief Complaint  Patient presents with   Recurrent UTI     HPI: Jackie Rangel is a 87 y.o.female who presents today for further evaluation urinary tract infection.   She has a personal history of  pelvic organ prolapse grade 3 posterior grade 1, she also has a pessary. She is seen by Dr. Dalbert Garnet who recommended using estrogen cream on urethra and craneberry tablets.  Her daughter is requesting this appointment.   She was put on daily low dose macrobid recently by Dr. Hyacinth Meeker / Dalbert Garnet which she his is about to complete.  She had a document UTI with growth of E.coli in May which was resistant to ampicillin and cephalosporins and another document infection in February.   All other urine cultures have been negative; last urinalysis was negative.  Over the past 3 months, she reports being treated with 5 different rounds of antibiotics for Fairly bland appearing urinalyses with associated negative urine cultures.   She is accompanied today by her daughter. She reports today that she has experienced burning for the last 2 days. She states she had been experiencing frequency as primary complaint. She states she has been having headaches accompanied by tightness. She has been experiencing dizziness and she has stopped driving due to this. She is seeing ENT later today.  She states that she has been using the estrogen every so often. She he taking her cranberry tablets twice a day. She is using the estrogen in the vagina via applicator.  She has not been using it per urethral regularly.  Her daughter states that she hs been experiencing lower back pain. She states this is new.  Urinalysis today looks contaminated today on initial evaluation.    PMH: Past Medical History:  Diagnosis Date   Anxiety     Arthritis    Asthma    COPD (chronic obstructive pulmonary disease) (HCC)    not on home o2   Dysrhythmia    Edema    ANKLES   Esophageal spasm    GERD (gastroesophageal reflux disease)    Headache    MIGRAINES   HOH (hard of hearing)    AIDS   Hypertension    Hypothyroidism    Osteopenia    Peripheral neuropathy    TIA (transient ischemic attack)     Surgical History: Past Surgical History:  Procedure Laterality Date   APPENDECTOMY     CATARACT EXTRACTION W/PHACO Right 03/08/2017   Procedure: CATARACT EXTRACTION PHACO AND INTRAOCULAR LENS PLACEMENT ([facility]);  Surgeon: Lockie Mola, MD;  Location: [facility] ORS;  Service: Ophthalmology;  Laterality: Right;  [place] 00:53.6 AP% 10.5 CDE5.65 FLUID LOT # [id number] H   CATARACT EXTRACTION W/PHACO Left 04/14/2017   Procedure: CATARACT EXTRACTION PHACO AND INTRAOCULAR LENS PLACEMENT ([facility]);  Surgeon: Lockie Mola, MD;  Location: [facility] ORS;  Service: Ophthalmology;  Laterality: Left;  [place] 00:43.2 AP% 14.0 CDE 6.07 Fluid pack lot # [id number] H   CHOLECYSTECTOMY     THYROIDECTOMY, PARTIAL      Home Medications:  Allergies as of 11/19/2020       Reactions   Cortisone Other (See Comments)   Headaches    Prednisone Rash        Medication List        Accurate as of November 19, 2020 10:38 AM. If you have any  questions, ask your nurse or doctor.          acetaminophen 325 MG tablet Commonly known as: TYLENOL Take 325 mg by mouth every 6 (six) hours as needed for moderate pain or headache.   ALPRAZolam 0.25 MG tablet Commonly known as: XANAX Take 0.25 tablets by mouth at bedtime.   CALCIUM 600+D PO Take 1 tablet by mouth 2 (two) times daily.   clopidogrel 75 MG tablet Commonly known as: PLAVIX Take 75 mg by mouth daily.   diltiazem 180 MG 24 hr capsule Commonly known as: TIAZAC Take 180 mg by mouth daily.   esomeprazole 40 MG capsule Commonly known as: NEXIUM Take 40 mg by mouth 2 (two) times daily.    hydrochlorothiazide 25 MG tablet Commonly known as: HYDRODIURIL Take 25 mg by mouth daily as needed (for swelling).   hyoscyamine 0.125 MG tablet Commonly known as: LEVSIN Take 0.125 mg by mouth daily.   levothyroxine 112 MCG tablet Commonly known as: SYNTHROID Take 112 mcg by mouth daily before breakfast.   methocarbamol 500 MG tablet Commonly known as: ROBAXIN Take 500 mg by mouth at bedtime as needed for muscle spasms.   montelukast 10 MG tablet Commonly known as: SINGULAIR Take 10 mg by mouth at bedtime.   polyethylene glycol powder 17 GM/SCOOP powder Commonly known as: GLYCOLAX/MIRALAX Take 17 g by mouth daily.   propranolol 20 MG tablet Commonly known as: INDERAL Take 20 mg by mouth 2 (two) times daily.   raloxifene 60 MG tablet Commonly known as: EVISTA Take 60 mg by mouth every evening.   SALONPAS PAIN RELIEF PATCH EX Apply 1 patch topically daily as needed (for knee pain).   simvastatin 20 MG tablet Commonly known as: ZOCOR Take 20 mg by mouth at bedtime.        Allergies:  Allergies  Allergen Reactions   Cortisone Other (See Comments)    Headaches    Prednisone Rash    Family History: Family History  Problem Relation Age of Onset   Breast cancer Daughter 26   Ovarian cancer Mother    CAD Father     Social History:  reports that she has never smoked. She has never used smokeless tobacco. She reports that she does not drink alcohol and does not use drugs.   Physical Exam: BP 126/66   Pulse 61   Ht 5' (1.524 m)   Wt 135 lb (61.2 kg)   BMI 26.37 kg/m   Constitutional:  Alert and oriented, No acute distress. HEENT: [place] AT, moist mucus membranes.  Trachea midline, no masses. Cardiovascular: No clubbing, cyanosis, or edema. Respiratory: Normal respiratory effort, no increased work of breathing. Skin: No rashes, bruises or suspicious lesions. Neurologic: Grossly intact, no focal deficits, moving all 4 extremities. Psychiatric: Normal mood  and affect.  Laboratory Data:  Lab Results  Component Value Date   CREATININE 0.72 08/01/2015     Urinalysis - Contaminated, few RBC/ WBC/ bacteria present on initial specimen  -cath specimen recorded in EPIC  Pertinent Imaging: Results for orders placed or performed in visit on 11/19/20  Bladder Scan (Post Void Residual) in office  Result Value Ref Range   Scan Result 10     Assessment & Plan:    Recurrent UTI  - Continue topical estrogen cream and cranberry tablets  - Urinalysis self collect contaminated; returned later in the afternoon on the same day for catheter specimen to get clean catch urine to rule out infection. Both showed very different  results, the latter being completely negative with no WBCs or RBCs.  These 2 urinalysis were given to the patient and her daughter for side-by-side comparison to illustrate the importance of technique and collection.  She in fact does not have evidence of a UTI today and suspect that over the past several months, this has not been the appropriate diagnosis. - IF she continues having symptoms she is welcome to come back to clinic to for catheterized specimen  2. Pelvic prolapse  - follow up with Dr Dalbert Garnet  -CMA noted significant pelvic prolapse at the time of catheterized specimen collection, had to reduce large prolapse in order to visualize urethra which is consistent with the above assessment  3. OAB  - Symptoms consistent urinary frequency and urgency - Trial Gemtesa 75 mg x 1 month samples for bladder overactivity   Return in 1 month for PVR/ reevalatuion   I,Kailey Littlejohn,acting as a scribe for Vanna Scotland, MD.,have documented all relevant documentation on the behalf of Vanna Scotland, MD,as directed by  Vanna Scotland, MD while in the presence of Vanna Scotland, MD.  I have reviewed the above documentation for accuracy and completeness, and I agree with the above.   Vanna Scotland, MD   [facility] [address] [place], [place] [address] [phone]  I spent 60 total minutes on the day of the encounter including pre-visit review of the medical record, face-to-face time with the patient, and post visit ordering of labs/imaging/tests.  I spent a fair amount of time reviewing the chart, and primary time with the patient and her daughter x 2 occations today.

## 2020-11-20 ENCOUNTER — Ambulatory Visit: Payer: Self-pay | Admitting: Urology

## 2020-11-20 LAB — MICROSCOPIC EXAMINATION: Bacteria, UA: NONE SEEN

## 2020-11-20 LAB — URINALYSIS, COMPLETE
Bilirubin, UA: NEGATIVE
Glucose, UA: NEGATIVE
Nitrite, UA: NEGATIVE
Protein,UA: NEGATIVE
Specific Gravity, UA: 1.025 (ref 1.005–1.030)
Urobilinogen, Ur: 0.2 mg/dL (ref 0.2–1.0)
pH, UA: 6 (ref 5.0–7.5)

## 2020-11-28 ENCOUNTER — Ambulatory Visit
Admission: RE | Admit: 2020-11-28 | Discharge: 2020-11-28 | Disposition: A | Discharge status: Home / Self Care | Payer: Medicare Other | Source: Ambulatory Visit | Attending: Otolaryngology | Admitting: Otolaryngology

## 2020-11-28 DIAGNOSIS — R42 Dizziness and giddiness: Secondary | ICD-10-CM | POA: Diagnosis not present

## 2020-12-19 ENCOUNTER — Ambulatory Visit: Payer: Medicare Other | Admitting: Urology

## 2021-01-06 NOTE — Progress Notes (Signed)
01/07/21 2:10 PM   Jackie Rangel September 10, 1928 [id number]  Referring provider:  Danella Penton, MD [phone] [facility] [address] [facility] [place],  [place] [address]  Chief Complaint  Patient presents with   Follow-up    Urological history: 1. Pelvic organ prolapse -pelvic organ prolapse grade 3 posterior grade 1 -followed by Dr. Dalbert Rangel as well -managed with vaginal estrogen cream and pessary   2. rUTI's -contributing factors of age, vaginal atrophy, poor perineal hygiene and incontinence -documented positive urine cultures over the last year  07/16/2020 E.coli  04/09/2020 E.coli  3. OAB -contributing factors of age, vaginal atrophy, HTN, COPD, anxiety and diuretics -PVR 15 mL   HPI: Jackie Rangel is a 87 y.o.female who presents today for a one month follow up for a PVR after a trial of Gemtesa 75 mg daily with her daughter, Jackie Rangel.   She states that her urinary symptoms have improved.  She is noted less frequency and less incontinence.  She is also wondering whether or not the placement of the newer pessary is also contributing to the improvement in her urinary symptoms.  Patient denies any modifying or aggravating factors.  Patient denies any gross hematuria, dysuria or suprapubic/flank pain.  Patient denies any fevers, chills, nausea or vomiting.    PMH: Past Medical History:  Diagnosis Date   Anxiety    Arthritis    Asthma    COPD (chronic obstructive pulmonary disease) (HCC)    not on home o2   Dysrhythmia    Edema    ANKLES   Esophageal spasm    GERD (gastroesophageal reflux disease)    Headache    MIGRAINES   HOH (hard of hearing)    AIDS   Hypertension    Hypothyroidism    Osteopenia    Peripheral neuropathy    TIA (transient ischemic attack)     Surgical History: Past Surgical History:  Procedure Laterality Date   APPENDECTOMY     CATARACT EXTRACTION W/PHACO Right 03/08/2017   Procedure: CATARACT EXTRACTION PHACO AND  INTRAOCULAR LENS PLACEMENT (I[facility]);  Surgeon: Jackie Mola, MD;  Location: [facility] ORS;  Service: Ophthalmology;  Laterality: Right;  [place] 00:53.6 AP% 10.5 CDE5.65 FLUID LOT # [id number] H   CATARACT EXTRACTION W/PHACO Left 04/14/2017   Procedure: CATARACT EXTRACTION PHACO AND INTRAOCULAR LENS PLACEMENT (I[facility]);  Surgeon: Jackie Mola, MD;  Location: [facility] ORS;  Service: Ophthalmology;  Laterality: Left;  [place] 00:43.2 AP% 14.0 CDE 6.07 Fluid pack lot # [id number]   CHOLECYSTECTOMY     THYROIDECTOMY, PARTIAL      Home Medications:  Allergies as of 01/07/2021       Reactions   Cortisone Other (See Comments)   Headaches    Prednisone Rash        Medication List        Accurate as of January 07, 2021  2:10 PM. If you have any questions, ask your nurse or doctor.          STOP taking these medications    ALPRAZolam 0.25 MG tablet Commonly known as: XANAX Stopped by: Jackie Cowboy, PA-C   clopidogrel 75 MG tablet Commonly known as: PLAVIX Stopped by: Jackie Chalk, PA-C   diltiazem 180 MG 24 hr capsule Commonly known as: TIAZAC Stopped by: Jackie Rask, PA-C   esomeprazole 40 MG capsule Commonly known as: NEXIUM Stopped by: Mischa Pollard, PA-C   hyoscyamine 0.125 MG tablet Commonly known as: LEVSIN Stopped by: Jackie Cowboy, PA-C   methocarbamol 500  MG tablet Commonly known as: ROBAXIN Stopped by: Jackie Banker, PA-C   montelukast 10 MG tablet Commonly known as: SINGULAIR Stopped by: Jackie Rudman, PA-C   raloxifene 60 MG tablet Commonly known as: EVISTA Stopped by: Jackie Evitt, PA-C   simvastatin 20 MG tablet Commonly known as: ZOCOR Stopped by: Jackie Council, PA-C       TAKE these medications    acetaminophen 325 MG tablet Commonly known as: TYLENOL Take 325 mg by mouth every 6 (six) hours as needed for moderate pain or headache.   CALCIUM 600+D PO Take 1 tablet by mouth 2 (two) times daily.   hydrochlorothiazide 25 MG  tablet Commonly known as: HYDRODIURIL Take 25 mg by mouth daily as needed (for swelling).   levothyroxine 112 MCG tablet Commonly known as: SYNTHROID Take 112 mcg by mouth daily before breakfast.   polyethylene glycol powder 17 GM/SCOOP powder Commonly known as: GLYCOLAX/MIRALAX Take 17 g by mouth daily.   propranolol 20 MG tablet Commonly known as: INDERAL Take 20 mg by mouth 2 (two) times daily.   SALONPAS PAIN RELIEF PATCH EX Apply 1 patch topically daily as needed (for knee pain).        Allergies:  Allergies  Allergen Reactions   Cortisone Other (See Comments)    Headaches    Prednisone Rash    Family History: Family History  Problem Relation Age of Onset   Breast cancer Daughter 6   Ovarian cancer Mother    CAD Father     Social History:  reports that she has never smoked. She has never used smokeless tobacco. She reports that she does not drink alcohol and does not use drugs.   Physical Exam: BP 117/64   Pulse 65   Ht 5' (1.524 m)   Wt 136 lb (61.7 kg)   BMI 26.56 kg/m   Constitutional:  Well nourished. Alert and oriented, No acute distress. HEENT: Jackie Rangel AT, mask in place.  Trachea midline Cardiovascular: No clubbing, cyanosis, or edema. Respiratory: Normal respiratory effort, no increased work of breathing. Neurologic: Grossly intact, no focal deficits, moving all 4 extremities. Psychiatric: Normal mood and affect.    Laboratory Data: N/A  Pertinent Imaging: Results for Jackie Rangel (MRN [id number]) as of 01/07/2021 14:10  Ref. Range 01/07/2021 13:33  Scan Result Unknown 15     Assessment & Plan:    1. OAB -We discussed not taking any further OAB medications to see if her urinary symptoms are now stabilized with the new pessary -She will discontinue the Gemtesa at this time and will contact us in the future if she feels she is in need of an overactive bladder medication  2. Pelvic prolapse -has follow up on 01/21/2021 with Dr.  Leafy Rangel   3. Recurrent UTI  - Continue topical estrogen cream and cranberry tablets  - IF she continues having symptoms she is welcome to come back to clinic to for catheterized specimen     Jackie Rangel, [facility]   Jackie Rangel [address] [place], [place] [address] [phone]

## 2021-01-07 ENCOUNTER — Other Ambulatory Visit: Payer: Self-pay

## 2021-01-07 ENCOUNTER — Ambulatory Visit (INDEPENDENT_AMBULATORY_CARE_PROVIDER_SITE_OTHER): Payer: Medicare Other | Admitting: Urology

## 2021-01-07 ENCOUNTER — Encounter: Payer: Self-pay | Admitting: Urology

## 2021-01-07 VITALS — BP 117/64 | HR 65 | Ht 60.0 in | Wt 136.0 lb

## 2021-01-07 DIAGNOSIS — N3281 Overactive bladder: Secondary | ICD-10-CM

## 2021-01-07 LAB — BLADDER SCAN AMB NON-IMAGING: Scan Result: 15

## 2021-01-15 ENCOUNTER — Other Ambulatory Visit: Payer: Self-pay | Admitting: Internal Medicine

## 2021-01-15 DIAGNOSIS — G450 Vertebro-basilar artery syndrome: Secondary | ICD-10-CM

## 2021-01-28 ENCOUNTER — Ambulatory Visit: Payer: Medicare Other | Admitting: Obstetrics and Gynecology

## 2021-01-29 ENCOUNTER — Other Ambulatory Visit: Payer: Self-pay

## 2021-01-29 ENCOUNTER — Ambulatory Visit
Admission: RE | Admit: 2021-01-29 | Discharge: 2021-01-29 | Disposition: A | Discharge status: Home / Self Care | Payer: Medicare Other | Source: Ambulatory Visit | Attending: Internal Medicine | Admitting: Internal Medicine

## 2021-01-29 DIAGNOSIS — G450 Vertebro-basilar artery syndrome: Secondary | ICD-10-CM | POA: Insufficient documentation

## 2021-01-29 MED ORDER — GADOBUTROL 1 MMOL/ML IV SOLN
6.0000 mL | Freq: Once | INTRAVENOUS | Status: AC | PRN
  Administered 2021-01-29: 7.5 mL via INTRAVENOUS

## 2021-12-29 ENCOUNTER — Encounter: Payer: Self-pay | Admitting: *Deleted

## 2023-01-08 IMAGING — MG MM DIGITAL SCREENING BILAT W/ TOMO AND CAD
8 series · 9 of 24 positions shown · non-contrast
Comparison: Previous exam(s).

CLINICAL DATA: Screening.

EXAM:
DIGITAL SCREENING BILATERAL MAMMOGRAM WITH TOMOSYNTHESIS AND CAD
TECHNIQUE: Bilateral screening digital craniocaudal and mediolateral oblique
mammograms were obtained. Bilateral screening digital breast
tomosynthesis was performed. The images were evaluated with
computer-aided detection.

[R MLO synth-2D]
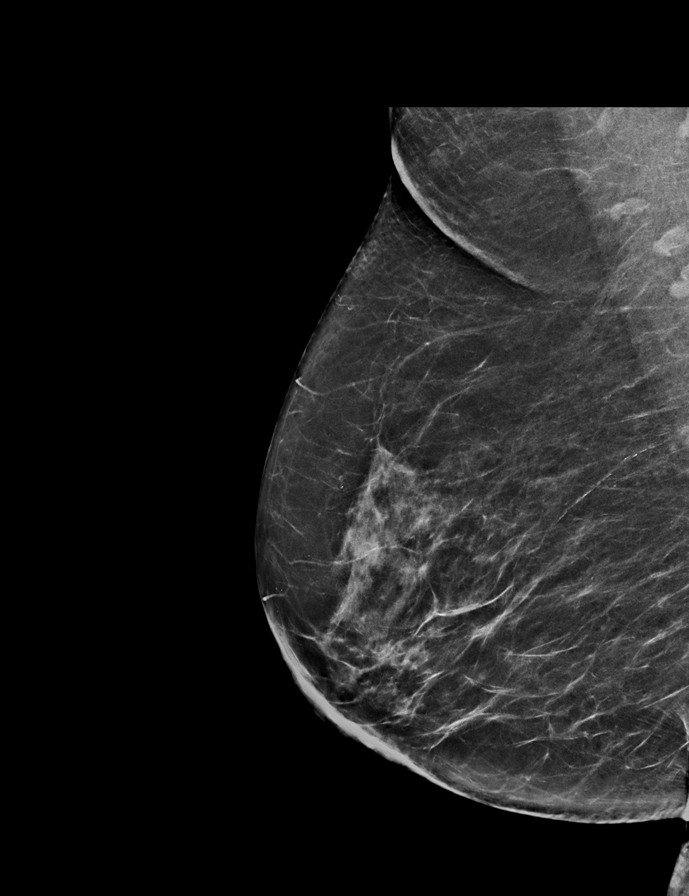

[L MLO synth-2D]
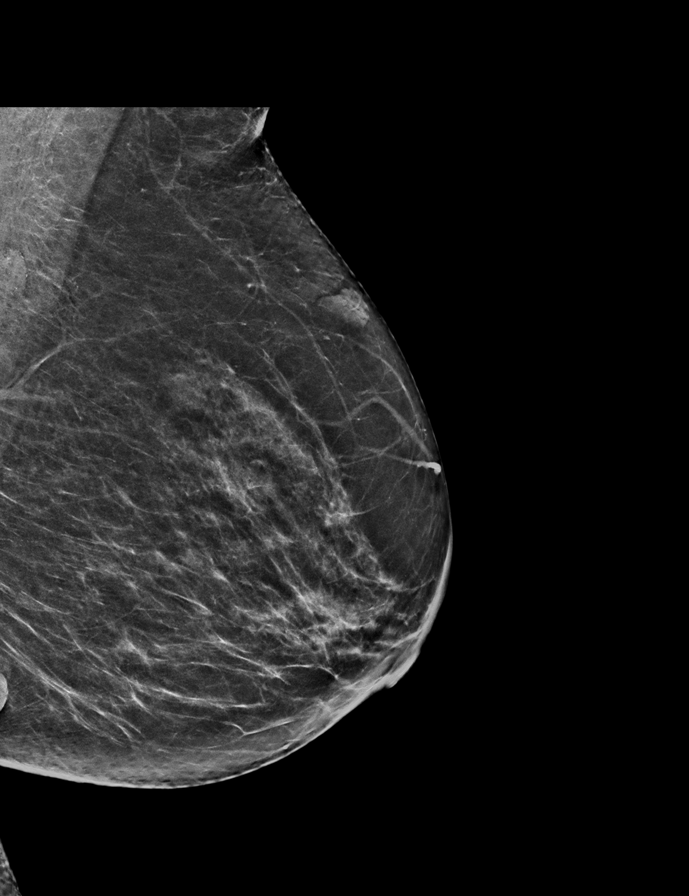

[L CC synth-2D]
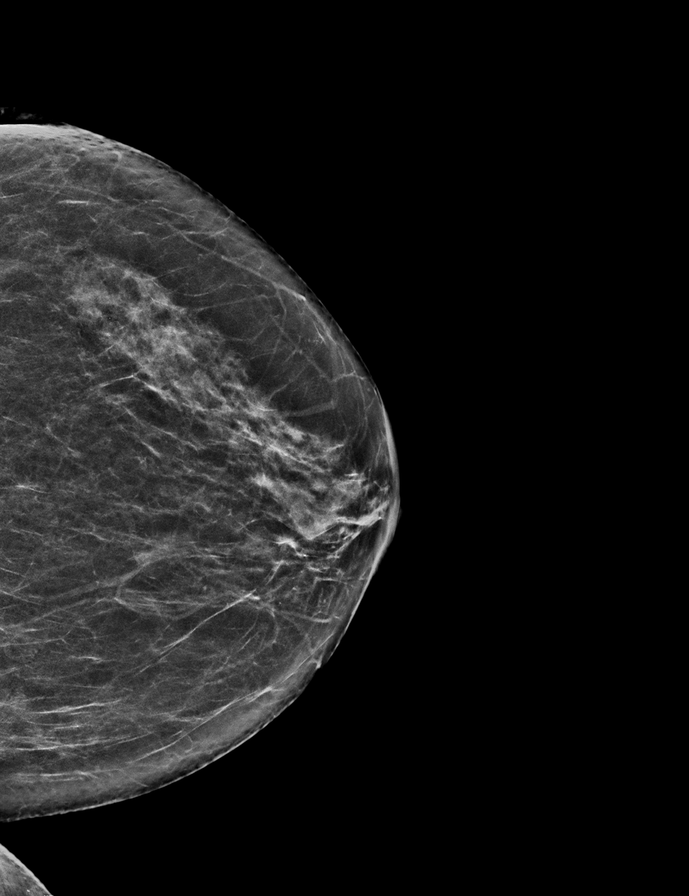

[R CC synth-2D]
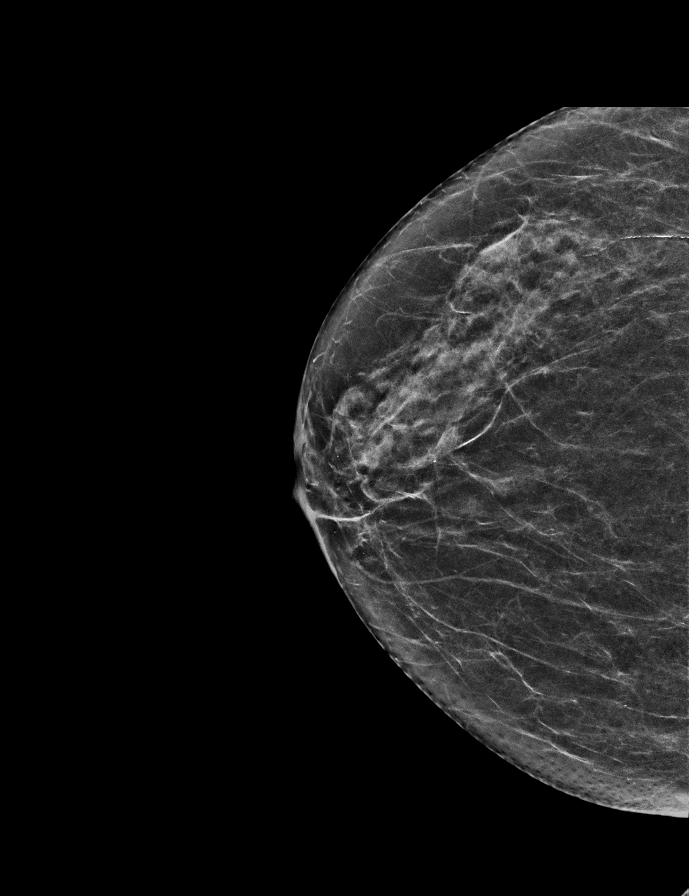

[L CC tomo · 2 of 57 frames shown]
[frame 19/57]
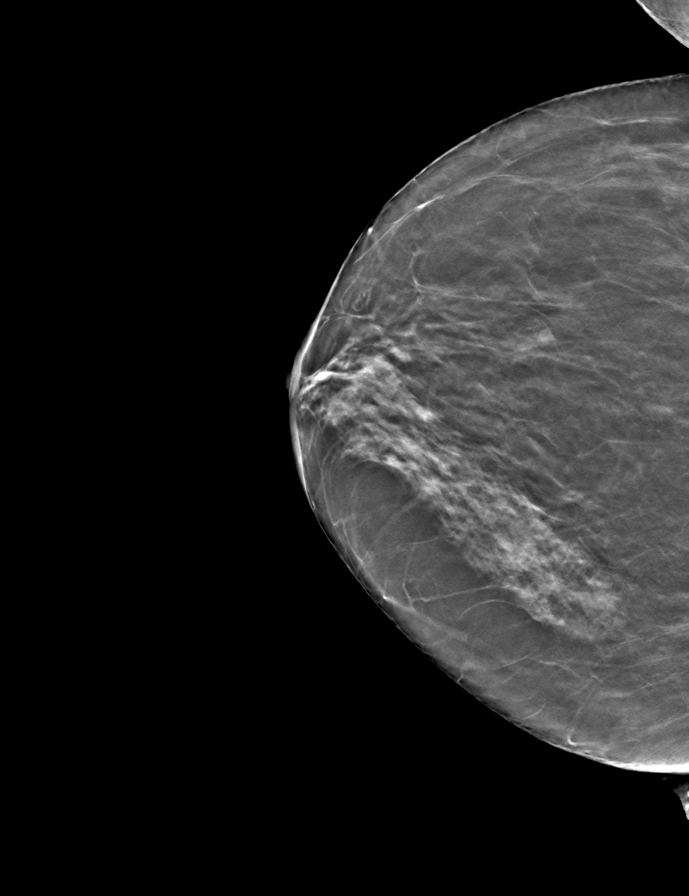
[frame 29/57]
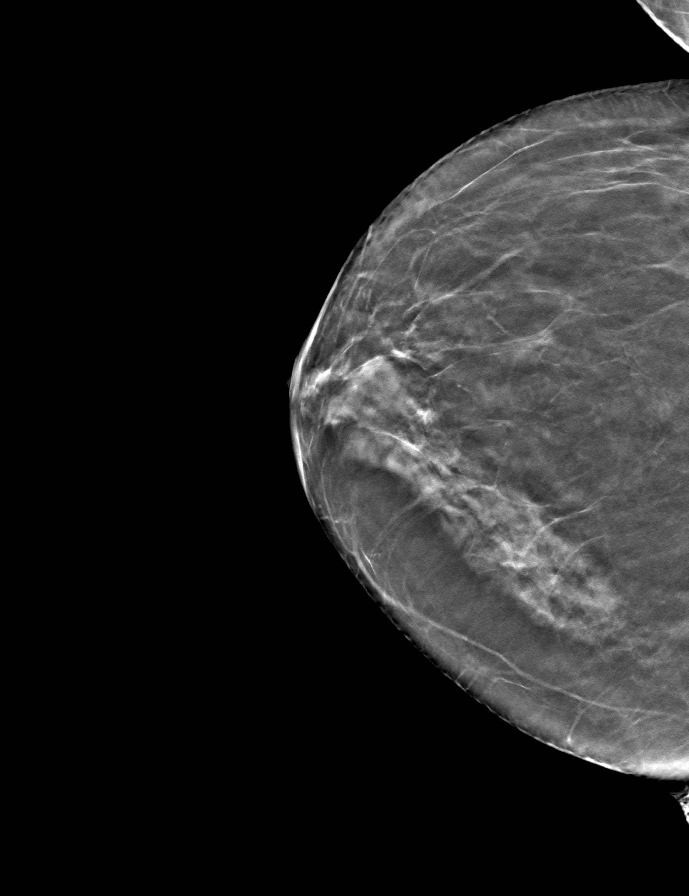

[R CC tomo · tomo slice 27/54.0]
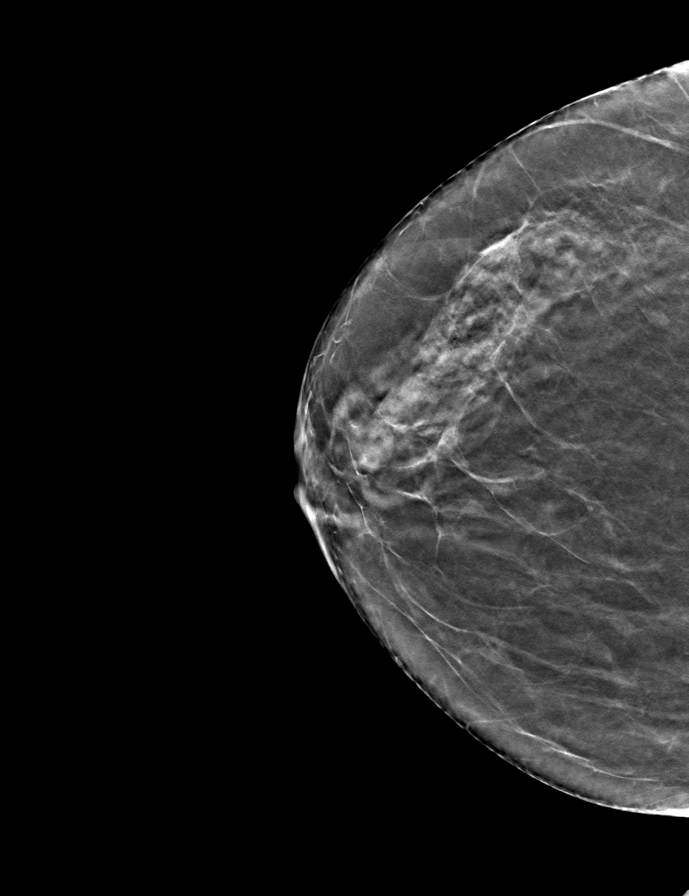

[L MLO tomo · tomo slice 31/60.0]
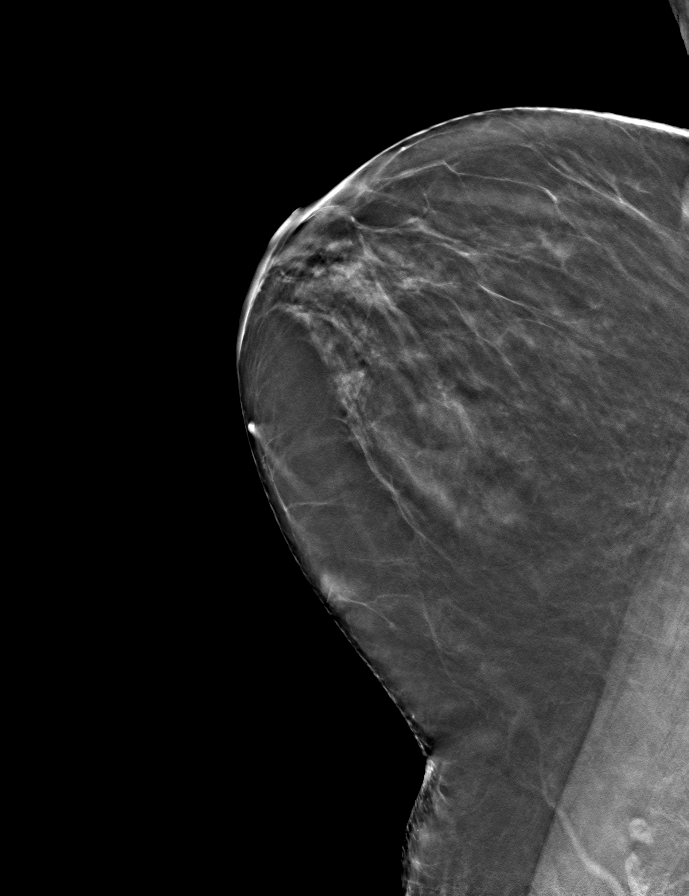

[R MLO tomo · tomo slice 33/66.0]
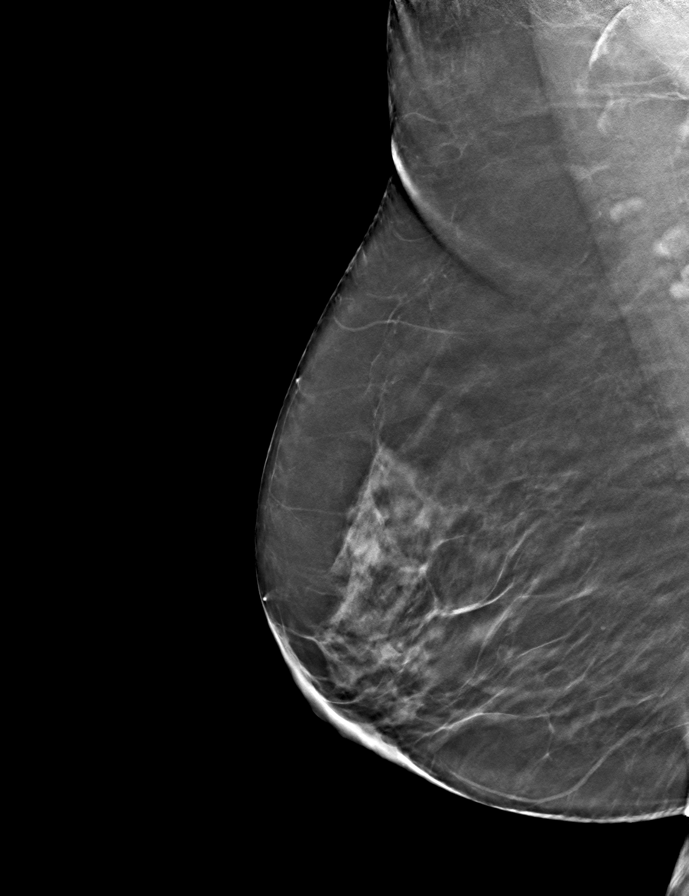

[9 of 24 positions shown; findings below may reference images not displayed]

ACR Breast Density Category c: The breast tissue is heterogeneously
dense, which may obscure small masses.
FINDINGS: There are no findings suspicious for malignancy.
IMPRESSION: No mammographic evidence of malignancy. A result letter of this
screening mammogram will be mailed directly to the patient.

RECOMMENDATION:
Screening mammogram in one year. (Code:Q3-W-BC3)

BI-RADS CATEGORY  1: Negative.

## 2024-05-26 ENCOUNTER — Ambulatory Visit: Admit: 2024-05-26 | Admitting: Ophthalmology

## 2024-05-26 SURGERY — REPAIR, ENTROPION
Anesthesia: Monitor Anesthesia Care | Laterality: Left
